# Patient Record
Sex: Female | Born: 1985 | Race: White | Hispanic: No | Marital: Single | Smoking: Former smoker
Health system: Southern US, Community
[De-identification: ages and names within clinical notes are randomized; demographics above are authoritative.]

## PROBLEM LIST (undated history)

## (undated) DIAGNOSIS — D219 Benign neoplasm of connective and other soft tissue, unspecified: Secondary | ICD-10-CM

## (undated) DIAGNOSIS — F329 Major depressive disorder, single episode, unspecified: Secondary | ICD-10-CM

## (undated) DIAGNOSIS — F32A Depression, unspecified: Secondary | ICD-10-CM

## (undated) DIAGNOSIS — F4310 Post-traumatic stress disorder, unspecified: Secondary | ICD-10-CM

## (undated) HISTORY — DX: Major depressive disorder, single episode, unspecified: F32.9

## (undated) HISTORY — DX: Post-traumatic stress disorder, unspecified: F43.10

## (undated) HISTORY — DX: Depression, unspecified: F32.A

---

## 2004-01-12 ENCOUNTER — Inpatient Hospital Stay (HOSPITAL_COMMUNITY): Admission: AD | Admit: 2004-01-12 | Discharge: 2004-01-12 | Payer: Self-pay

## 2004-01-23 ENCOUNTER — Inpatient Hospital Stay (HOSPITAL_COMMUNITY): Admission: RE | Admit: 2004-01-23 | Discharge: 2004-01-25 | Payer: Self-pay | Admitting: Gynecology

## 2004-10-26 ENCOUNTER — Emergency Department: Payer: Self-pay | Admitting: Emergency Medicine

## 2008-02-08 ENCOUNTER — Emergency Department: Payer: Self-pay | Admitting: Emergency Medicine

## 2011-01-10 ENCOUNTER — Emergency Department: Payer: Self-pay | Admitting: Emergency Medicine

## 2012-06-14 ENCOUNTER — Emergency Department: Payer: Self-pay | Admitting: Emergency Medicine

## 2013-07-31 ENCOUNTER — Emergency Department: Payer: Self-pay | Admitting: Emergency Medicine

## 2013-08-02 LAB — BETA STREP CULTURE(ARMC)

## 2014-03-01 ENCOUNTER — Emergency Department: Payer: Self-pay | Admitting: Student

## 2014-03-01 LAB — BASIC METABOLIC PANEL
ANION GAP: 6 — AB (ref 7–16)
BUN: 10 mg/dL (ref 7–18)
CALCIUM: 8.3 mg/dL — AB (ref 8.5–10.1)
CHLORIDE: 108 mmol/L — AB (ref 98–107)
CREATININE: 0.85 mg/dL (ref 0.60–1.30)
Co2: 26 mmol/L (ref 21–32)
EGFR (Non-African Amer.): >60
Glucose: 77 mg/dL (ref 65–99)
Osmolality: 277 (ref 275–301)
Potassium: 3.7 mmol/L (ref 3.5–5.1)
Sodium: 140 mmol/L (ref 136–145)

## 2014-03-01 LAB — URINALYSIS, COMPLETE
BACTERIA: NONE SEEN
BILIRUBIN, UR: NEGATIVE
BLOOD: NEGATIVE
Glucose,UR: NEGATIVE mg/dL (ref 0–75)
KETONE: NEGATIVE
Leukocyte Esterase: NEGATIVE
Nitrite: NEGATIVE
Ph: 7 (ref 4.5–8.0)
Protein: NEGATIVE
RBC,UR: 3 /HPF (ref 0–5)
SPECIFIC GRAVITY: 1.008 (ref 1.003–1.030)

## 2014-03-01 LAB — CBC WITH DIFFERENTIAL/PLATELET
BASOS ABS: 0.1 10*3/uL (ref 0.0–0.1)
Basophil %: 0.6 %
Eosinophil #: 0.3 10*3/uL (ref 0.0–0.7)
Eosinophil %: 1.9 %
HCT: 44.9 % (ref 35.0–47.0)
HGB: 15.5 g/dL (ref 12.0–16.0)
Lymphocyte #: 4.3 10*3/uL — ABNORMAL HIGH (ref 1.0–3.6)
Lymphocyte %: 29.8 %
MCH: 31.6 pg (ref 26.0–34.0)
MCHC: 34.4 g/dL (ref 32.0–36.0)
MCV: 92 fL (ref 80–100)
Monocyte #: 1 x10 3/mm — ABNORMAL HIGH (ref 0.2–0.9)
Monocyte %: 6.7 %
NEUTROS ABS: 8.8 10*3/uL — AB (ref 1.4–6.5)
NEUTROS PCT: 61 %
Platelet: 288 10*3/uL (ref 150–440)
RBC: 4.89 10*6/uL (ref 3.80–5.20)
RDW: 12.4 % (ref 11.5–14.5)
WBC: 14.4 10*3/uL — ABNORMAL HIGH (ref 3.6–11.0)

## 2014-03-01 LAB — TROPONIN I

## 2014-03-30 ENCOUNTER — Inpatient Hospital Stay: Payer: Self-pay | Admitting: Psychiatry

## 2014-03-30 LAB — URINALYSIS, COMPLETE
BILIRUBIN, UR: NEGATIVE
Glucose,UR: NEGATIVE mg/dL (ref 0–75)
NITRITE: NEGATIVE
PH: 6 (ref 4.5–8.0)
RBC,UR: 22 /HPF (ref 0–5)
SPECIFIC GRAVITY: 1.029 (ref 1.003–1.030)
Squamous Epithelial: 11

## 2014-03-30 LAB — COMPREHENSIVE METABOLIC PANEL
ALT: 17 U/L
ANION GAP: 6 — AB (ref 7–16)
Albumin: 4 g/dL (ref 3.4–5.0)
Alkaline Phosphatase: 68 U/L
BUN: 7 mg/dL (ref 7–18)
Bilirubin,Total: 0.8 mg/dL (ref 0.2–1.0)
CHLORIDE: 107 mmol/L (ref 98–107)
CO2: 26 mmol/L (ref 21–32)
Calcium, Total: 8.8 mg/dL (ref 8.5–10.1)
Creatinine: 0.9 mg/dL (ref 0.60–1.30)
EGFR (African American): >60
Glucose: 132 mg/dL — ABNORMAL HIGH (ref 65–99)
Osmolality: 277 (ref 275–301)
Potassium: 3.4 mmol/L — ABNORMAL LOW (ref 3.5–5.1)
SGOT(AST): 13 U/L — ABNORMAL LOW (ref 15–37)
SODIUM: 139 mmol/L (ref 136–145)
Total Protein: 7.2 g/dL (ref 6.4–8.2)

## 2014-03-30 LAB — DRUG SCREEN, URINE

## 2014-03-30 LAB — CBC
HCT: 47.4 % — ABNORMAL HIGH (ref 35.0–47.0)
HGB: 15.9 g/dL (ref 12.0–16.0)
MCH: 31.4 pg (ref 26.0–34.0)
MCHC: 33.5 g/dL (ref 32.0–36.0)
MCV: 94 fL (ref 80–100)
PLATELETS: 307 10*3/uL (ref 150–440)
RBC: 5.06 10*6/uL (ref 3.80–5.20)
RDW: 12.6 % (ref 11.5–14.5)
WBC: 13.3 10*3/uL — ABNORMAL HIGH (ref 3.6–11.0)

## 2014-03-30 LAB — SALICYLATE LEVEL: Salicylates, Serum: 4.4 mg/dL — ABNORMAL HIGH

## 2014-03-30 LAB — ETHANOL: Ethanol: <3 mg/dL

## 2014-03-30 LAB — ACETAMINOPHEN LEVEL: Acetaminophen: <2 ug/mL

## 2014-04-01 LAB — TSH: Thyroid Stimulating Horm: 1.33 u[IU]/mL

## 2014-05-07 ENCOUNTER — Emergency Department: Payer: Self-pay | Admitting: Emergency Medicine

## 2014-05-07 LAB — CBC WITH DIFFERENTIAL/PLATELET
BASOS PCT: 0.5 %
Basophil #: 0.1 10*3/uL (ref 0.0–0.1)
EOS PCT: 2.1 %
Eosinophil #: 0.3 10*3/uL (ref 0.0–0.7)
HCT: 44.1 % (ref 35.0–47.0)
HGB: 14.7 g/dL (ref 12.0–16.0)
LYMPHS ABS: 3.2 10*3/uL (ref 1.0–3.6)
Lymphocyte %: 26.6 %
MCH: 31.9 pg (ref 26.0–34.0)
MCHC: 33.3 g/dL (ref 32.0–36.0)
MCV: 96 fL (ref 80–100)
MONO ABS: 0.8 x10 3/mm (ref 0.2–0.9)
Monocyte %: 7 %
Neutrophil #: 7.7 10*3/uL — ABNORMAL HIGH (ref 1.4–6.5)
Neutrophil %: 63.8 %
Platelet: 232 10*3/uL (ref 150–440)
RBC: 4.61 10*6/uL (ref 3.80–5.20)
RDW: 13.1 % (ref 11.5–14.5)
WBC: 12.1 10*3/uL — ABNORMAL HIGH (ref 3.6–11.0)

## 2014-05-07 LAB — BASIC METABOLIC PANEL
Anion Gap: 7 (ref 7–16)
BUN: 6 mg/dL — ABNORMAL LOW (ref 7–18)
CALCIUM: 8.1 mg/dL — AB (ref 8.5–10.1)
CO2: 23 mmol/L (ref 21–32)
Chloride: 111 mmol/L — ABNORMAL HIGH (ref 98–107)
Creatinine: 0.82 mg/dL (ref 0.60–1.30)
EGFR (African American): >60
EGFR (Non-African Amer.): >60
Glucose: 82 mg/dL (ref 65–99)
Osmolality: 278 (ref 275–301)
Potassium: 3.9 mmol/L (ref 3.5–5.1)
Sodium: 141 mmol/L (ref 136–145)

## 2014-05-07 LAB — TROPONIN I: Troponin-I: <0.02 ng/mL

## 2014-06-05 ENCOUNTER — Emergency Department: Payer: Self-pay | Admitting: Internal Medicine

## 2014-06-30 ENCOUNTER — Emergency Department: Payer: Self-pay | Admitting: Emergency Medicine

## 2014-08-07 ENCOUNTER — Emergency Department
Admit: 2014-08-07 | Disposition: A | Discharge status: Home / Self Care | Payer: Self-pay | Admitting: Emergency Medicine

## 2014-08-09 NOTE — Consult Note (Signed)
PATIENT NAME:  Zoe Savage, GESELL MR#:  [id number] DATE OF BIRTH:  1985-10-12  DATE OF CONSULTATION:  03/30/2014  REFERRING PHYSICIAN:   CONSULTING PHYSICIAN:  Reisa Coppola K. Franchot Mimes, MD  PLACE OF DICTATION:  ER at [facility], [place], [place], [place].  RACE:  White.  SUBJECTIVE: The patient was seen in consultation in ER at [facility], [place].  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 The patient is married for 12 years and has 2 children that are 45 year old and 93 year old. Currently separated for 2 weeks per his choice.  The patient quite depressed about her current living situation and so she came here for help.    CHIEF COMPLAINT: "I need help. I'm feeling depressed."  HISTORY OF PRESENT ILLNESS:  The patient started getting depressed when separation after husband of 12 years left her with 2 kids.  The patient started having crying spells while talking about this, in fact, she could not stop crying when talking about her children and husband leaving her.    PAST PSYCHIATRIC HISTORY:  No previous history of Inpt to psychiatry.  No history of suicide attempts and not being followed by any psychiatrist.  ALCOHOL AND DRUGS:  Denies drinking alcohol. Does admit smoking THC occasionally.  Does admit smoking nicotine cigarettes 1/3 pack a day for many years.  MENTAL STATUS:  The patient is disheveled in appearance, alert and oriented, calm and quiet at present.  Affect is flat.  Mood restricted.  Very depressed. Crying spells which she could not stop.  She was crying throughout the interview. Does not appear to be responding to internal stimuli.  Cognition intact.  General knowledge and information fair.  Does have suicidal wishes and thoughts, but currently contracted for safety and wants to get better.  Insight and judgment guarded.  Impulse control poor.  IMPRESSION:  Major depressive  disorder, single episode.  RECOMMENDATIONS:  Inpatient hospital in psychiatry for close observation and evaluation.    ____________________________ Wallace Cullens. Franchot Mimes, MD skc:LT D: 03/30/2014 18:03:59 ET T: 03/30/2014 19:17:27 ET JOB#: [id number]  cc: Arlyn Leak K. Franchot Mimes, MD, <Dictator> Dewain Penning MD ELECTRONICALLY SIGNED 04/05/2014 15:13
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
## 2015-03-02 ENCOUNTER — Emergency Department: Payer: Self-pay

## 2015-03-02 ENCOUNTER — Emergency Department
Admission: EM | Admit: 2015-03-02 | Discharge: 2015-03-02 | Disposition: A | Discharge status: Home / Self Care | Payer: Self-pay | Attending: Emergency Medicine | Admitting: Emergency Medicine

## 2015-03-02 ENCOUNTER — Encounter: Payer: Self-pay | Admitting: Emergency Medicine

## 2015-03-02 DIAGNOSIS — N939 Abnormal uterine and vaginal bleeding, unspecified: Secondary | ICD-10-CM | POA: Insufficient documentation

## 2015-03-02 DIAGNOSIS — F17210 Nicotine dependence, cigarettes, uncomplicated: Secondary | ICD-10-CM | POA: Insufficient documentation

## 2015-03-02 DIAGNOSIS — Z3202 Encounter for pregnancy test, result negative: Secondary | ICD-10-CM | POA: Insufficient documentation

## 2015-03-02 HISTORY — DX: Benign neoplasm of connective and other soft tissue, unspecified: D21.9

## 2015-03-02 LAB — URINALYSIS COMPLETE WITH MICROSCOPIC (ARMC ONLY)
BACTERIA UA: NONE SEEN
Bilirubin Urine: NEGATIVE
Glucose, UA: NEGATIVE mg/dL
Ketones, ur: NEGATIVE mg/dL
Leukocytes, UA: NEGATIVE
Nitrite: NEGATIVE
PROTEIN: NEGATIVE mg/dL
Specific Gravity, Urine: 1.017 (ref 1.005–1.030)
pH: 6 (ref 5.0–8.0)

## 2015-03-02 LAB — BASIC METABOLIC PANEL
ANION GAP: 6 (ref 5–15)
BUN: 10 mg/dL (ref 6–20)
CO2: 27 mmol/L (ref 22–32)
Calcium: 9.1 mg/dL (ref 8.9–10.3)
Chloride: 103 mmol/L (ref 101–111)
Creatinine, Ser: 0.72 mg/dL (ref 0.44–1.00)
Glucose, Bld: 105 mg/dL — ABNORMAL HIGH (ref 65–99)
POTASSIUM: 3.7 mmol/L (ref 3.5–5.1)
SODIUM: 136 mmol/L (ref 135–145)

## 2015-03-02 LAB — CBC
HEMATOCRIT: 45.1 % (ref 35.0–47.0)
HEMOGLOBIN: 15.5 g/dL (ref 12.0–16.0)
MCH: 32 pg (ref 26.0–34.0)
MCHC: 34.4 g/dL (ref 32.0–36.0)
MCV: 93.2 fL (ref 80.0–100.0)
Platelets: 338 10*3/uL (ref 150–440)
RBC: 4.84 MIL/uL (ref 3.80–5.20)
RDW: 12.5 % (ref 11.5–14.5)
WBC: 14.2 10*3/uL — ABNORMAL HIGH (ref 3.6–11.0)

## 2015-03-02 LAB — POCT PREGNANCY, URINE: PREG TEST UR: NEGATIVE

## 2015-03-02 MED ORDER — PROCHLORPERAZINE MALEATE 10 MG PO TABS
10.0000 mg | ORAL_TABLET | Freq: Once | ORAL | Status: AC
  Administered 2015-03-02: 10 mg via ORAL
  Filled 2015-03-02: qty 1

## 2015-03-02 NOTE — ED Notes (Signed)
AAOx3.  Skin warm and dry.,  NAD.,

## 2015-03-02 NOTE — ED Notes (Signed)
Denies dysuria.  C/O vaginal bleeding x 3 weeks.  Blood flow intermittently heavy. States blood flow is minimal today.

## 2015-03-02 NOTE — Discharge Instructions (Signed)
Please seek medical attention for any high fevers, chest pain, shortness of breath, change in behavior, persistent vomiting, bloody stool or any other new or concerning symptoms.   Abnormal Uterine Bleeding Abnormal uterine bleeding can affect women at various stages in life, including teenagers, women in their reproductive years, pregnant women, and women who have reached menopause. Several kinds of uterine bleeding are considered abnormal, including:  Bleeding or spotting between periods.   Bleeding after sexual intercourse.   Bleeding that is heavier or more than normal.   Periods that last longer than usual.  Bleeding after menopause.  Many cases of abnormal uterine bleeding are minor and simple to treat, while others are more serious. Any type of abnormal bleeding should be evaluated by your health care provider. Treatment will depend on the cause of the bleeding. HOME CARE INSTRUCTIONS Monitor your condition for any changes. The following actions may help to alleviate any discomfort you are experiencing:  Avoid the use of tampons and douches as directed by your health care provider.  Change your pads frequently. You should get regular pelvic exams and Pap tests. Keep all follow-up appointments for diagnostic tests as directed by your health care provider.  SEEK MEDICAL CARE IF:   Your bleeding lasts more than 1 week.   You feel dizzy at times.  SEEK IMMEDIATE MEDICAL CARE IF:   You pass out.   You are changing pads every 15 to 30 minutes.   You have abdominal pain.  You have a fever.   You become sweaty or weak.   You are passing large blood clots from the vagina.   You start to feel nauseous and vomit. MAKE SURE YOU:   Understand these instructions.  Will watch your condition.  Will get help right away if you are not doing well or get worse.   This information is not intended to replace advice given to you by your health care provider. Make sure  you discuss any questions you have with your health care provider.   Document Released: 04/04/2005 Document Revised: 04/09/2013 Document Reviewed: 11/01/2012 Elsevier Interactive Patient Education [employer].

## 2015-03-02 NOTE — ED Provider Notes (Signed)
[facility] Emergency Department Provider Note   ____________________________________________  Time seen: 2030  I have reviewed the triage vital signs and the nursing notes.   HISTORY  Chief Complaint Vaginal Bleeding; Headache; and Back Pain   History limited by: Not Limited   HPI Zoe Savage is a 29 y.o. female who presents to the emergency department today with primary complaint of vaginal bleeding. The patient states that she has been having vaginal bleeding for the past 3 weeks. It is intermittently heavier. She has had some mild, cramping. She states she usually is fairly regular. The past couple of days she developed a frontal headache as well as low back pain. She has a history of migraines but states the headache is slightly different. She denies the same constellation of symptoms in the past.   Past Medical History  Diagnosis Date  . Fibroids     There are no active problems to display for this patient.   Past Surgical History  Procedure Laterality Date  . Cesarean section      No current outpatient prescriptions on file.  Allergies Morphine and related  No family history on file.  Social History Social History  Substance Use Topics  . Smoking status: Current Every Day Smoker -- 1.00 packs/day    Types: Cigarettes  . Smokeless tobacco: None  . Alcohol Use: No    Review of Systems  Constitutional: Negative for fever. Cardiovascular: Negative for chest pain. Respiratory: Negative for shortness of breath. Gastrointestinal: Negative for abdominal pain, vomiting and diarrhea. Genitourinary: Negative for dysuria. Musculoskeletal: Negative for back pain. Skin: Negative for rash. Neurological: Negative for headaches, focal weakness or numbness.  10-point ROS otherwise negative.  ____________________________________________   PHYSICAL EXAM:  VITAL SIGNS: ED Triage Vitals  Enc Vitals Group     BP 03/02/15 1923  138/82 mmHg     Pulse Rate 03/02/15 1923 77     Resp --      Temp 03/02/15 1923 98.3 F (36.8 C)     Temp Source 03/02/15 1923 Oral     SpO2 03/02/15 1923 98 %     Weight 03/02/15 1923 175 lb (79.379 kg)     Height 03/02/15 1923 5\' 2"  (1.575 m)     Head Cir --      Peak Flow --      Pain Score 03/02/15 1926 8   Constitutional: Alert and oriented. Well appearing and in no distress. Eyes: Conjunctivae are normal. PERRL. Normal extraocular movements. ENT   Head: Normocephalic and atraumatic.   Nose: No congestion/rhinnorhea.   Mouth/Throat: Mucous membranes are moist.   Neck: No stridor. Hematological/Lymphatic/Immunilogical: No cervical lymphadenopathy. Cardiovascular: Normal rate, regular rhythm.  No murmurs, rubs, or gallops. Respiratory: Normal respiratory effort without tachypnea nor retractions. Breath sounds are clear and equal bilaterally. No wheezes/rales/rhonchi. Gastrointestinal: Soft and nontender. No distention.  Genitourinary: Deferred Musculoskeletal: Normal range of motion in all extremities. No joint effusions.  No lower extremity tenderness nor edema. Neurologic:  Normal speech and language. No gross focal neurologic deficits are appreciated.  Skin:  Skin is warm, dry and intact. No rash noted. Psychiatric: Mood and affect are normal. Speech and behavior are normal. Patient exhibits appropriate insight and judgment.  ____________________________________________    LABS (pertinent positives/negatives)  Labs Reviewed  CBC - Abnormal; Notable for the following:    WBC 14.2 (*)    All other components within normal limits  URINALYSIS COMPLETEWITH MICROSCOPIC ([facility] ONLY) - Abnormal; Notable for the  following:    Color, Urine YELLOW (*)    APPearance CLEAR (*)    Hgb urine dipstick 2+ (*)    Squamous Epithelial / LPF 0-5 (*)    All other components within normal limits  BASIC METABOLIC PANEL - Abnormal; Notable for the following:    Glucose, Bld 105  (*)    All other components within normal limits  POC URINE PREG, ED  POCT PREGNANCY, URINE     ____________________________________________   EKG  None  ____________________________________________    RADIOLOGY  Transvaginal US  IMPRESSION: 1. No evidence of mass, torsion, or other acute abnormality.    ____________________________________________   PROCEDURES  Procedure(s) performed: None  Critical Care performed: No  ____________________________________________   INITIAL IMPRESSION / ASSESSMENT AND PLAN / ED COURSE  Pertinent labs & imaging results that were available during my care of the patient were reviewed by me and considered in my medical decision making (see chart for details).  Patient presented to the emergency department today with concerns for abnormal vaginal bleeding. It's been going on for roughly 2-3 weeks. Blood work without any anemia. Vital signs stable. Ultrasound was performed which did not show any obvious cause of the bleeding. Discussed with patient importance of following up with OB/GYN. Discussed with patient acute blood loss return precautions.  ____________________________________________   FINAL CLINICAL IMPRESSION(S) / ED DIAGNOSES  Final diagnoses:  Abnormal vaginal bleeding  Abnormal vaginal bleeding     Nance Pear, MD 03/02/15 2228

## 2015-03-02 NOTE — ED Notes (Signed)
Patient ambulatory to triage with steady gait, without difficulty or distress noted; pt reports vag bleeding x 2-3wks accomp by frontal HA and lower back pain; denies hx of same

## 2015-05-22 ENCOUNTER — Encounter: Payer: Self-pay | Admitting: Emergency Medicine

## 2015-05-22 ENCOUNTER — Emergency Department
Admission: EM | Admit: 2015-05-22 | Discharge: 2015-05-22 | Disposition: A | Discharge status: Home / Self Care | Payer: No Typology Code available for payment source | Attending: Emergency Medicine | Admitting: Emergency Medicine

## 2015-05-22 DIAGNOSIS — S0990XA Unspecified injury of head, initial encounter: Secondary | ICD-10-CM | POA: Insufficient documentation

## 2015-05-22 DIAGNOSIS — O99331 Smoking (tobacco) complicating pregnancy, first trimester: Secondary | ICD-10-CM | POA: Diagnosis not present

## 2015-05-22 DIAGNOSIS — F17210 Nicotine dependence, cigarettes, uncomplicated: Secondary | ICD-10-CM | POA: Insufficient documentation

## 2015-05-22 DIAGNOSIS — Y92410 Unspecified street and highway as the place of occurrence of the external cause: Secondary | ICD-10-CM | POA: Insufficient documentation

## 2015-05-22 DIAGNOSIS — Y998 Other external cause status: Secondary | ICD-10-CM | POA: Diagnosis not present

## 2015-05-22 DIAGNOSIS — Z3A00 Weeks of gestation of pregnancy not specified: Secondary | ICD-10-CM | POA: Insufficient documentation

## 2015-05-22 DIAGNOSIS — O9A211 Injury, poisoning and certain other consequences of external causes complicating pregnancy, first trimester: Secondary | ICD-10-CM | POA: Diagnosis present

## 2015-05-22 DIAGNOSIS — Z3491 Encounter for supervision of normal pregnancy, unspecified, first trimester: Secondary | ICD-10-CM

## 2015-05-22 DIAGNOSIS — S199XXA Unspecified injury of neck, initial encounter: Secondary | ICD-10-CM | POA: Diagnosis not present

## 2015-05-22 DIAGNOSIS — Y9389 Activity, other specified: Secondary | ICD-10-CM | POA: Insufficient documentation

## 2015-05-22 DIAGNOSIS — S3991XA Unspecified injury of abdomen, initial encounter: Secondary | ICD-10-CM | POA: Diagnosis not present

## 2015-05-22 DIAGNOSIS — S3992XA Unspecified injury of lower back, initial encounter: Secondary | ICD-10-CM | POA: Diagnosis not present

## 2015-05-22 LAB — URINALYSIS COMPLETE WITH MICROSCOPIC (ARMC ONLY)
BILIRUBIN URINE: NEGATIVE
Bacteria, UA: NONE SEEN
Glucose, UA: NEGATIVE mg/dL
Hgb urine dipstick: NEGATIVE
Ketones, ur: NEGATIVE mg/dL
Nitrite: NEGATIVE
PH: 6 (ref 5.0–8.0)
Protein, ur: NEGATIVE mg/dL
Specific Gravity, Urine: 1.02 (ref 1.005–1.030)

## 2015-05-22 LAB — POCT PREGNANCY, URINE: Preg Test, Ur: POSITIVE — AB

## 2015-05-22 MED ORDER — CYCLOBENZAPRINE HCL 10 MG PO TABS: 10.0000 mg | ORAL_TABLET | Freq: Three times a day (TID) | ORAL | Status: DC | PRN

## 2015-05-22 MED ORDER — CYCLOBENZAPRINE HCL 10 MG PO TABS
10.0000 mg | ORAL_TABLET | Freq: Once | ORAL | Status: AC
  Administered 2015-05-22: 10 mg via ORAL
  Filled 2015-05-22: qty 1

## 2015-05-22 MED ORDER — ACETAMINOPHEN 500 MG PO TABS
1000.0000 mg | ORAL_TABLET | Freq: Once | ORAL | Status: AC
  Administered 2015-05-22: 1000 mg via ORAL
  Filled 2015-05-22: qty 2

## 2015-05-22 MED ORDER — ONDANSETRON HCL 4 MG PO TABS: 4.0000 mg | ORAL_TABLET | Freq: Every day | ORAL | Status: DC | PRN
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
## 2015-06-03 ENCOUNTER — Emergency Department: Payer: Self-pay

## 2015-06-03 ENCOUNTER — Emergency Department
Admission: EM | Admit: 2015-06-03 | Discharge: 2015-06-04 | Disposition: A | Discharge status: Home / Self Care | Payer: Self-pay | Attending: Emergency Medicine | Admitting: Emergency Medicine

## 2015-06-03 ENCOUNTER — Encounter: Payer: Self-pay | Admitting: Emergency Medicine

## 2015-06-03 DIAGNOSIS — R1030 Lower abdominal pain, unspecified: Secondary | ICD-10-CM | POA: Insufficient documentation

## 2015-06-03 DIAGNOSIS — N898 Other specified noninflammatory disorders of vagina: Secondary | ICD-10-CM | POA: Insufficient documentation

## 2015-06-03 DIAGNOSIS — O9989 Other specified diseases and conditions complicating pregnancy, childbirth and the puerperium: Secondary | ICD-10-CM | POA: Insufficient documentation

## 2015-06-03 DIAGNOSIS — O99331 Smoking (tobacco) complicating pregnancy, first trimester: Secondary | ICD-10-CM | POA: Insufficient documentation

## 2015-06-03 DIAGNOSIS — F17210 Nicotine dependence, cigarettes, uncomplicated: Secondary | ICD-10-CM | POA: Insufficient documentation

## 2015-06-03 DIAGNOSIS — Z3A01 Less than 8 weeks gestation of pregnancy: Secondary | ICD-10-CM | POA: Insufficient documentation

## 2015-06-03 DIAGNOSIS — R102 Pelvic and perineal pain: Secondary | ICD-10-CM | POA: Insufficient documentation

## 2015-06-03 DIAGNOSIS — R05 Cough: Secondary | ICD-10-CM | POA: Insufficient documentation

## 2015-06-03 DIAGNOSIS — O26891 Other specified pregnancy related conditions, first trimester: Secondary | ICD-10-CM

## 2015-06-03 LAB — URINALYSIS COMPLETE WITH MICROSCOPIC (ARMC ONLY)
BILIRUBIN URINE: NEGATIVE
GLUCOSE, UA: NEGATIVE mg/dL
HGB URINE DIPSTICK: NEGATIVE
Nitrite: NEGATIVE
PH: 5 (ref 5.0–8.0)
Protein, ur: 30 mg/dL — AB
SPECIFIC GRAVITY, URINE: 1.031 — AB (ref 1.005–1.030)

## 2015-06-03 LAB — POCT PREGNANCY, URINE: Preg Test, Ur: POSITIVE — AB

## 2015-06-03 LAB — HCG, QUANTITATIVE, PREGNANCY: HCG, BETA CHAIN, QUANT, S: 13845 m[IU]/mL — AB (ref <5)

## 2015-06-03 NOTE — Discharge Instructions (Signed)
Return to the ER for new or worsening pain, vaginal bleeding, passing out, or for any other concerns.  You need your blood pregnancy level rechecked in 48 hours.  Please come to the medical mall with the form you were given Friday night.  Let your doctor at [facility] know you need a repeat ultrasound in 2 wks.  Today, your blood pregnancy was   Ref. Range 06/03/2015 19:27  HCG, Beta Chain, Quant, S Latest Ref Range: <5 mIU/mL 13845 (H)  Your doctor can call to find out Friday's result.  You may show them these ultrasound results: IMPRESSION: Probable early intrauterine gestational sac, but no yolk sac, fetal pole, or cardiac activity yet visualized. Recommend follow-up quantitative B-HCG levels and follow-up US in 14 days to confirm and assess viability.  Abdominal Pain During Pregnancy Abdominal pain is common in pregnancy. Most of the time, it does not cause harm. There are many causes of abdominal pain. Some causes are more serious than others. Some of the causes of abdominal pain in pregnancy are easily diagnosed. Occasionally, the diagnosis takes time to understand. Other times, the cause is not determined. Abdominal pain can be a sign that something is very wrong with the pregnancy, or the pain may have nothing to do with the pregnancy at all. For this reason, always tell your health care provider if you have any abdominal discomfort. HOME CARE INSTRUCTIONS  Monitor your abdominal pain for any changes. The following actions may help to alleviate any discomfort you are experiencing:  Do not have sexual intercourse or put anything in your vagina until your symptoms go away completely.  Get plenty of rest until your pain improves.  Drink clear fluids if you feel nauseous. Avoid solid food as long as you are uncomfortable or nauseous.  Only take over-the-counter or prescription medicine as directed by your health care provider.  Keep all follow-up appointments with your health care  provider. SEEK IMMEDIATE MEDICAL CARE IF:  You are bleeding, leaking fluid, or passing tissue from the vagina.  You have increasing pain or cramping.  You have persistent vomiting.  You have painful or bloody urination.  You have a fever.  You notice a decrease in your baby's movements.  You have extreme weakness or feel faint.  You have shortness of breath, with or without abdominal pain.  You develop a severe headache with abdominal pain.  You have abnormal vaginal discharge with abdominal pain.  You have persistent diarrhea.  You have abdominal pain that continues even after rest, or gets worse. MAKE SURE YOU:   Understand these instructions.  Will watch your condition.  Will get help right away if you are not doing well or get worse.   This information is not intended to replace advice given to you by your health care provider. Make sure you discuss any questions you have with your health care provider.   Document Released: 04/04/2005 Document Revised: 01/23/2013 Document Reviewed: 11/01/2012 Elsevier Interactive Patient Education [employer].

## 2015-06-03 NOTE — ED Provider Notes (Signed)
[facility] Emergency Department Provider Note ____________________________________________  Time seen: Approximately 9:12 PM  I have reviewed the triage vital signs and the nursing notes.   HISTORY  Chief Complaint Pelvic Pain   HPI Zoe Savage is a 30 y.o. female G3P2 with LMP 12/10, but has very irregular cycles, who presents with lower abdominal cramps for several weeks that have gotten worse tonight. She has not had any vaginal bleeding. She did have cramps with her prior pregnancies. Vaginal discharge is consistent with prior pregnancies. She lives in [place] and first [place] [facility]  She states she walks 20 miles at work every night.  She also reports having a cough and is wondering if she could've pulled a muscle.   Past Medical History  Diagnosis Date  . Fibroids     There are no active problems to display for this patient.   Past Surgical History  Procedure Laterality Date  . Cesarean section      Current Outpatient Rx  Name  Route  Sig  Dispense  Refill  . cyclobenzaprine (FLEXERIL) 10 MG tablet   Oral   Take 1 tablet (10 mg total) by mouth every 8 (eight) hours as needed for muscle spasms.   30 tablet   1   . ondansetron (ZOFRAN) 4 MG tablet   Oral   Take 1 tablet (4 mg total) by mouth daily as needed for nausea or vomiting.   20 tablet   1     Allergies Morphine and related  No family history on file.  Social History Social History  Substance Use Topics  . Smoking status: Current Every Day Smoker -- 1.00 packs/day    Types: Cigarettes  . Smokeless tobacco: None  . Alcohol Use: No    Review of Systems Constitutional: No fever/chills Cardiovascular: Denies chest pain. Respiratory: Denies shortness of breath. Gastrointestinal:see hpi Genitourinary: see hpi.  10-point ROS otherwise negative.  ____________________________________________   PHYSICAL EXAM:  VITAL SIGNS: ED  Triage Vitals  Enc Vitals Group     BP 06/03/15 1921 114/65 mmHg     Pulse Rate 06/03/15 1921 91     Resp 06/03/15 1921 18     Temp 06/03/15 1921 98.2 F (36.8 C)     Temp Source 06/03/15 1921 Oral     SpO2 06/03/15 1921 97 %     Weight 06/03/15 1921 190 lb (86.183 kg)     Height 06/03/15 1921 5\' 3"  (1.6 m)     Head Cir --      Peak Flow --      Pain Score 06/03/15 1922 4     Pain Loc --      Pain Edu? --      Excl. in [place]? --    Constitutional: Alert and oriented. Well appearing and in no acute distress. Eyes: Conjunctivae are normal. PERRL. EOMI. Head: Atraumatic. Nose: No congestion/rhinnorhea. Mouth/Throat: Mucous membranes are moist.  Oropharynx non-erythematous. Neck: No stridor.  Cardiovascular: Normal rate, regular rhythm. Grossly normal heart sounds.  Good peripheral circulation. Respiratory: Normal respiratory effort.  No retractions. Lungs CTAB. Gastrointestinal: Soft and nontender. No distention.  GU: Normal external genitalia, white vaginal discharge, no bleeding, no cervical motion tenderness, mild bilateral adnexal tenderness Musculoskeletal: No lower extremity tenderness nor edema.   Neurologic:  Normal speech and language. No gross focal neurologic deficits are appreciated. No gait instability. Skin:  Skin is warm, dry and intact. No rash noted. Psychiatric: Mood  and affect are normal. Speech and behavior are normal.  ____________________________________________   LABS (all labs ordered are listed, but only abnormal results are displayed)  Labs Reviewed  HCG, QUANTITATIVE, PREGNANCY - Abnormal; Notable for the following:    hCG, Beta Chain, Quant, S 13845 (*)    All other components within normal limits  URINALYSIS COMPLETEWITH MICROSCOPIC ([facility] ONLY) - Abnormal; Notable for the following:    Color, Urine AMBER (*)    APPearance HAZY (*)    Ketones, ur TRACE (*)    Specific Gravity, Urine 1.031 (*)    Protein, ur 30 (*)    Leukocytes, UA TRACE (*)     Bacteria, UA FEW (*)    Squamous Epithelial / LPF 6-30 (*)    All other components within normal limits  POCT PREGNANCY, URINE - Abnormal; Notable for the following:    Preg Test, Ur POSITIVE (*)    All other components within normal limits  ABO/RH   ________________________________________________________________________________  RADIOLOGY  1st trimester u/s: IMPRESSION: Probable early intrauterine gestational sac, but no yolk sac, fetal pole, or cardiac activity yet visualized. Recommend follow-up quantitative B-HCG levels and follow-up US in 14 days to confirm and assess viability. This recommendation follows SRU consensus guidelines: Diagnostic Criteria for Nonviable Pregnancy Early in the First Trimester. [place] Med [id number].  By mean sac diameter, the estimated gestational age is 5 weeks 6 days. ____________________________________________   INITIAL IMPRESSION / ASSESSMENT AND PLAN / ED COURSE  Pertinent labs & imaging results that were available during my care of the patient were reviewed by me and considered in my medical decision making (see chart for details).  Patient with bilateral lower pelvic pain in first trimester pregnancy. She has a follow-up in 6 days with her OB/GYN & will get her repeat B-HCG quant at the outpatient lab slip that I ordered for here in 48 hours. She understands she needs to have a follow-up ultrasound in 2 weeks. I will cancel the group and Rh as she has no bleeding. ____________________________________________   FINAL CLINICAL IMPRESSION(S) / ED DIAGNOSES  Pelvic pain in 1st trimester pregnancy   Ponciano Ort, MD 06/03/15 2306

## 2015-06-03 NOTE — ED Notes (Signed)
Pt in w/ complaints of intermittent lower abdominal cramping today, reporting severe cramping around 1700.  Pt denies pain at this time.  Pt denies any vaginal bleeding.  Pt reports she is [redacted] weeks pregnant but has not had first OB appointment yet.

## 2015-06-03 NOTE — ED Notes (Signed)
Patient transported to Ultrasound 

## 2015-06-03 NOTE — ED Notes (Signed)
Lower abdominal cramps.  Onset of symptoms this morning, worsening throughout the day becoming "really bad" at 1700.  Denies vaginal bleeding but has noticed large amount of clear vaginal drainage today.  Denies dysuria.  Patient is [redacted] weeks pregnant.  LMP:  03/28/2015.  Has first appointment with OB next Tuesday 2/21.

## 2015-06-04 LAB — CHLAMYDIA/NGC RT PCR (ARMC ONLY)
CHLAMYDIA TR: NOT DETECTED
N gonorrhoeae: NOT DETECTED

## 2015-06-04 LAB — WET PREP, GENITAL
CLUE CELLS WET PREP: NONE SEEN
Sperm: NONE SEEN
Trich, Wet Prep: NONE SEEN
WBC WET PREP: NONE SEEN
Yeast Wet Prep HPF POC: NONE SEEN

## 2015-06-08 ENCOUNTER — Other Ambulatory Visit: Payer: Self-pay | Admitting: Obstetrics and Gynecology

## 2015-06-08 DIAGNOSIS — O3680X0 Pregnancy with inconclusive fetal viability, not applicable or unspecified: Secondary | ICD-10-CM

## 2015-06-09 ENCOUNTER — Ambulatory Visit (INDEPENDENT_AMBULATORY_CARE_PROVIDER_SITE_OTHER): Payer: Managed Care, Other (non HMO)

## 2015-06-09 DIAGNOSIS — Z3A01 Less than 8 weeks gestation of pregnancy: Secondary | ICD-10-CM

## 2015-06-09 DIAGNOSIS — O3680X0 Pregnancy with inconclusive fetal viability, not applicable or unspecified: Secondary | ICD-10-CM | POA: Diagnosis not present

## 2015-06-09 NOTE — Progress Notes (Signed)
[place] 6+2 wks single IUP w/ys,pos fht 122 bpm,normal ov's bilat,crl 6.23mm

## 2015-06-17 ENCOUNTER — Ambulatory Visit (INDEPENDENT_AMBULATORY_CARE_PROVIDER_SITE_OTHER): Payer: Managed Care, Other (non HMO) | Admitting: Advanced Practice Midwife

## 2015-06-17 ENCOUNTER — Encounter: Payer: Self-pay | Admitting: Advanced Practice Midwife

## 2015-06-17 ENCOUNTER — Other Ambulatory Visit (HOSPITAL_COMMUNITY)
Admission: RE | Admit: 2015-06-17 | Discharge: 2015-06-17 | Disposition: A | Discharge status: Home / Self Care | Payer: Managed Care, Other (non HMO) | Source: Ambulatory Visit | Attending: Advanced Practice Midwife | Admitting: Advanced Practice Midwife

## 2015-06-17 VITALS — BP 110/70 | HR 93 | Wt 208.0 lb

## 2015-06-17 DIAGNOSIS — Z3490 Encounter for supervision of normal pregnancy, unspecified, unspecified trimester: Secondary | ICD-10-CM | POA: Insufficient documentation

## 2015-06-17 DIAGNOSIS — Z3481 Encounter for supervision of other normal pregnancy, first trimester: Secondary | ICD-10-CM

## 2015-06-17 DIAGNOSIS — O34219 Maternal care for unspecified type scar from previous cesarean delivery: Secondary | ICD-10-CM | POA: Diagnosis not present

## 2015-06-17 DIAGNOSIS — Z01419 Encounter for gynecological examination (general) (routine) without abnormal findings: Secondary | ICD-10-CM | POA: Diagnosis present

## 2015-06-17 DIAGNOSIS — Z1151 Encounter for screening for human papillomavirus (HPV): Secondary | ICD-10-CM | POA: Insufficient documentation

## 2015-06-17 DIAGNOSIS — Z113 Encounter for screening for infections with a predominantly sexual mode of transmission: Secondary | ICD-10-CM | POA: Insufficient documentation

## 2015-06-17 DIAGNOSIS — F17200 Nicotine dependence, unspecified, uncomplicated: Secondary | ICD-10-CM

## 2015-06-17 DIAGNOSIS — Z3491 Encounter for supervision of normal pregnancy, unspecified, first trimester: Secondary | ICD-10-CM

## 2015-06-17 DIAGNOSIS — Z1389 Encounter for screening for other disorder: Secondary | ICD-10-CM | POA: Diagnosis not present

## 2015-06-17 DIAGNOSIS — O99331 Smoking (tobacco) complicating pregnancy, first trimester: Secondary | ICD-10-CM | POA: Diagnosis not present

## 2015-06-17 DIAGNOSIS — Z3682 Encounter for antenatal screening for nuchal translucency: Secondary | ICD-10-CM

## 2015-06-17 DIAGNOSIS — Z3A08 8 weeks gestation of pregnancy: Secondary | ICD-10-CM | POA: Diagnosis not present

## 2015-06-17 DIAGNOSIS — Z98891 History of uterine scar from previous surgery: Secondary | ICD-10-CM

## 2015-06-17 DIAGNOSIS — Z0283 Encounter for blood-alcohol and blood-drug test: Secondary | ICD-10-CM

## 2015-06-17 DIAGNOSIS — Z331 Pregnant state, incidental: Secondary | ICD-10-CM | POA: Diagnosis not present

## 2015-06-17 DIAGNOSIS — Z124 Encounter for screening for malignant neoplasm of cervix: Secondary | ICD-10-CM

## 2015-06-17 DIAGNOSIS — Z369 Encounter for antenatal screening, unspecified: Secondary | ICD-10-CM

## 2015-06-17 NOTE — Patient Instructions (Signed)
 First Trimester of Pregnancy The first trimester of pregnancy is from week 1 until the end of week 12 (months 1 through 3). A week after a sperm fertilizes an egg, the egg will implant on the wall of the uterus. This embryo will begin to develop into a baby. Genes from you and your partner are forming the baby. The female genes determine whether the baby is a boy or a girl. At 6-8 weeks, the eyes and face are formed, and the heartbeat can be seen on ultrasound. At the end of 12 weeks, all the baby's organs are formed.  Now that you are pregnant, you will want to do everything you can to have a healthy baby. Two of the most important things are to get good prenatal care and to follow your health care provider's instructions. Prenatal care is all the medical care you receive before the baby's birth. This care will help prevent, find, and treat any problems during the pregnancy and childbirth. BODY CHANGES Your body goes through many changes during pregnancy. The changes vary from woman to woman.   You may gain or lose a couple of pounds at first.  You may feel sick to your stomach (nauseous) and throw up (vomit). If the vomiting is uncontrollable, call your health care provider.  You may tire easily.  You may develop headaches that can be relieved by medicines approved by your health care provider.  You may urinate more often. Painful urination may mean you have a bladder infection.  You may develop heartburn as a result of your pregnancy.  You may develop constipation because certain hormones are causing the muscles that push waste through your intestines to slow down.  You may develop hemorrhoids or swollen, bulging veins (varicose veins).  Your breasts may begin to grow larger and become tender. Your nipples may stick out more, and the tissue that surrounds them (areola) may become darker.  Your gums may bleed and may be sensitive to brushing and flossing.  Dark spots or blotches  (chloasma, mask of pregnancy) may develop on your face. This will likely fade after the baby is born.  Your menstrual periods will stop.  You may have a loss of appetite.  You may develop cravings for certain kinds of food.  You may have changes in your emotions from day to day, such as being excited to be pregnant or being concerned that something may go wrong with the pregnancy and baby.  You may have more vivid and strange dreams.  You may have changes in your hair. These can include thickening of your hair, rapid growth, and changes in texture. Some women also have hair loss during or after pregnancy, or hair that feels dry or thin. Your hair will most likely return to normal after your baby is born. WHAT TO EXPECT AT YOUR PRENATAL VISITS During a routine prenatal visit:  You will be weighed to make sure you and the baby are growing normally.  Your blood pressure will be taken.  Your abdomen will be measured to track your baby's growth.  The fetal heartbeat will be listened to starting around week 10 or 12 of your pregnancy.  Test results from any previous visits will be discussed. Your health care provider may ask you:  How you are feeling.  If you are feeling the baby move.  If you have had any abnormal symptoms, such as leaking fluid, bleeding, severe headaches, or abdominal cramping.  If you have any questions. Other   tests that may be performed during your first trimester include:  Blood tests to find your blood type and to check for the presence of any previous infections. They will also be used to check for low iron levels (anemia) and Rh antibodies. Later in the pregnancy, blood tests for diabetes will be done along with other tests if problems develop.  Urine tests to check for infections, diabetes, or protein in the urine.  An ultrasound to confirm the proper growth and development of the baby.  An amniocentesis to check for possible genetic problems.  Fetal  screens for spina bifida and Down syndrome.  You may need other tests to make sure you and the baby are doing well. HOME CARE INSTRUCTIONS  Medicines  Follow your health care provider's instructions regarding medicine use. Specific medicines may be either safe or unsafe to take during pregnancy.  Take your prenatal vitamins as directed.  If you develop constipation, try taking a stool softener if your health care provider approves. Diet  Eat regular, well-balanced meals. Choose a variety of foods, such as meat or vegetable-based protein, fish, milk and low-fat dairy products, vegetables, fruits, and whole grain breads and cereals. Your health care provider will help you determine the amount of weight gain that is right for you.  Avoid raw meat and uncooked cheese. These carry germs that can cause birth defects in the baby.  Eating four or five small meals rather than three large meals a day may help relieve nausea and vomiting. If you start to feel nauseous, eating a few soda crackers can be helpful. Drinking liquids between meals instead of during meals also seems to help nausea and vomiting.  If you develop constipation, eat more high-fiber foods, such as fresh vegetables or fruit and whole grains. Drink enough fluids to keep your urine clear or pale yellow. Activity and Exercise  Exercise only as directed by your health care provider. Exercising will help you:  Control your weight.  Stay in shape.  Be prepared for labor and delivery.  Experiencing pain or cramping in the lower abdomen or low back is a good sign that you should stop exercising. Check with your health care provider before continuing normal exercises.  Try to avoid standing for long periods of time. Move your legs often if you must stand in one place for a long time.  Avoid heavy lifting.  Wear low-heeled shoes, and practice good posture.  You may continue to have sex unless your health care provider directs you  otherwise. Relief of Pain or Discomfort  Wear a good support bra for breast tenderness.   Take warm sitz baths to soothe any pain or discomfort caused by hemorrhoids. Use hemorrhoid cream if your health care provider approves.   Rest with your legs elevated if you have leg cramps or low back pain.  If you develop varicose veins in your legs, wear support hose. Elevate your feet for 15 minutes, 3-4 times a day. Limit salt in your diet. Prenatal Care  Schedule your prenatal visits by the twelfth week of pregnancy. They are usually scheduled monthly at first, then more often in the last 2 months before delivery.  Write down your questions. Take them to your prenatal visits.  Keep all your prenatal visits as directed by your health care provider. Safety  Wear your seat belt at all times when driving.  Make a list of emergency phone numbers, including numbers for family, friends, the hospital, and police and fire departments. General   Tips  Ask your health care provider for a referral to a local prenatal education class. Begin classes no later than at the beginning of month 6 of your pregnancy.  Ask for help if you have counseling or nutritional needs during pregnancy. Your health care provider can offer advice or refer you to specialists for help with various needs.  Do not use hot tubs, steam rooms, or saunas.  Do not douche or use tampons or scented sanitary pads.  Do not cross your legs for long periods of time.  Avoid cat litter boxes and soil used by cats. These carry germs that can cause birth defects in the baby and possibly loss of the fetus by miscarriage or stillbirth.  Avoid all smoking, herbs, alcohol, and medicines not prescribed by your health care provider. Chemicals in these affect the formation and growth of the baby.  Schedule a dentist appointment. At home, brush your teeth with a soft toothbrush and be gentle when you floss. SEEK MEDICAL CARE IF:   You have  dizziness.  You have mild pelvic cramps, pelvic pressure, or nagging pain in the abdominal area.  You have persistent nausea, vomiting, or diarrhea.  You have a bad smelling vaginal discharge.  You have pain with urination.  You notice increased swelling in your face, hands, legs, or ankles. SEEK IMMEDIATE MEDICAL CARE IF:   You have a fever.  You are leaking fluid from your vagina.  You have spotting or bleeding from your vagina.  You have severe abdominal cramping or pain.  You have rapid weight gain or loss.  You vomit blood or material that looks like coffee grounds.  You are exposed to German measles and have never had them.  You are exposed to fifth disease or chickenpox.  You develop a severe headache.  You have shortness of breath.  You have any kind of trauma, such as from a fall or a car accident. Document Released: 03/29/2001 Document Revised: 08/19/2013 Document Reviewed: 02/12/2013 Exit[employer] Patient Information 2015 [employer]. This information is not intended to replace advice given to you by your health care provider. Make sure you discuss any questions you have with your health care provider.   Nausea & Vomiting  Have saltine crackers or pretzels by your bed and eat a few bites before you raise your head out of bed in the morning  Eat small frequent meals throughout the day instead of large meals  Drink plenty of fluids throughout the day to stay hydrated, just don't drink a lot of fluids with your meals.  This can make your stomach fill up faster making you feel sick  Do not brush your teeth right after you eat  Products with real ginger are good for nausea, like ginger ale and ginger hard candy Make sure it says made with real ginger!  Sucking on sour candy like lemon heads is also good for nausea  If your prenatal vitamins make you nauseated, take them at night so you will sleep through the nausea  Sea Bands  If you feel like you need  medicine for the nausea & vomiting please let us know  If you are unable to keep any fluids or food down please let us know   Constipation  Drink plenty of fluid, preferably water, throughout the day  Eat foods high in fiber such as fruits, vegetables, and grains  Exercise, such as walking, is a good way to keep your bowels regular  Drink warm fluids, especially warm   prune juice, or decaf coffee  Eat a 1/2 cup of real oatmeal (not instant), 1/2 cup applesauce, and 1/2-1 cup warm prune juice every day  If needed, you may take Colace (docusate sodium) stool softener once or twice a day to help keep the stool soft. If you are pregnant, wait until you are out of your first trimester (12-14 weeks of pregnancy)  If you still are having problems with constipation, you may take Miralax once daily as needed to help keep your bowels regular.  If you are pregnant, wait until you are out of your first trimester (12-14 weeks of pregnancy)  Safe Medications in Pregnancy   Acne: Benzoyl Peroxide Salicylic Acid  Backache/Headache: Tylenol: 2 regular strength every 4 hours OR              2 Extra strength every 6 hours  Colds/Coughs/Allergies: Benadryl (alcohol free) 25 mg every 6 hours as needed Breath right strips Claritin Cepacol throat lozenges Chloraseptic throat spray Cold-Eeze- up to three times per day Cough drops, alcohol free Flonase (by prescription only) Guaifenesin Mucinex Robitussin DM (plain only, alcohol free) Saline nasal spray/drops Sudafed (pseudoephedrine) & Actifed ** use only after [redacted] weeks gestation and if you do not have high blood pressure Tylenol Vicks Vaporub Zinc lozenges Zyrtec   Constipation: Colace Ducolax suppositories Fleet enema Glycerin suppositories Metamucil Milk of magnesia Miralax Senokot Smooth move tea  Diarrhea: Kaopectate Imodium A-D  *NO pepto Bismol  Hemorrhoids: Anusol Anusol HC Preparation  H Tucks  Indigestion: Tums Maalox Mylanta Zantac  Pepcid  Insomnia: Benadryl (alcohol free) 25mg every 6 hours as needed Tylenol PM Unisom, no Gelcaps  Leg Cramps: Tums MagGel  Nausea/Vomiting:  Bonine Dramamine Emetrol Ginger extract Sea bands Meclizine  Nausea medication to take during pregnancy:  Unisom (doxylamine succinate 25 mg tablets) Take one tablet daily at bedtime. If symptoms are not adequately controlled, the dose can be increased to a maximum recommended dose of two tablets daily (1/2 tablet in the morning, 1/2 tablet mid-afternoon and one at bedtime). Vitamin B6 100mg tablets. Take one tablet twice a day (up to 200 mg per day).  Skin Rashes: Aveeno products Benadryl cream or 25mg every 6 hours as needed Calamine Lotion 1% cortisone cream  Yeast infection: Gyne-lotrimin 7 Monistat 7   **If taking multiple medications, please check labels to avoid duplicating the same active ingredients **take medication as directed on the label ** Do not exceed 4000 mg of tylenol in 24 hours **Do not take medications that contain aspirin or ibuprofen      

## 2015-06-17 NOTE — Progress Notes (Signed)
  Subjective:    Zoe Savage is a [id number] [redacted]w[redacted]d being seen today for her first obstetrical visit.  Her obstetrical history is significant for smoker and hx of 2 C sections.  .  Pregnancy history fully reviewed.  First CS was for NRFHT at 6 cms, 2nd was repeat (not offered TOLAC).  Thinks she wants a TOLAC.  Discussed smoking cessation strategies, including patches and e cigs.  Declines Quitline referral.   Patient reports some nausea, doing OK.  Filed Vitals:   06/17/15 0835  BP: 110/70  Pulse: 93  Weight: 208 lb (94.348 kg)    HISTORY: OB History  Gravida Para Term Preterm AB SAB TAB Ectopic Multiple Living  3 2 2       2     # Outcome Date GA Lbr Len/2nd Weight Sex Delivery Anes PTL Lv  3 Current           2 Term 01/23/04 [redacted]w[redacted]d   F CS-Unspec Spinal N Y  1 Term 02/20/01 [redacted]w[redacted]d   F CS-Unspec EPI Y Y    Obstetric Comments  NRFHT   Past Medical History  Diagnosis Date  . Fibroids     removed during CS   Past Surgical History  Procedure Laterality Date  . Cesarean section     History reviewed. No pertinent family history.   Exam       Pelvic Exam:    Perineum: Normal Perineum   Vulva: normal   Vagina:  normal mucosa, normal discharge, no palpable nodules   Uterus Normal, Gravid, FH: 7     Cervix: Normal,  Pap collected   Adnexa: Not palpable   Urinary:  urethral meatus normal    System:     Skin: normal coloration and turgor, no rashes    Neurologic: oriented, normal, normal mood   Extremities: normal strength, tone, and muscle mass   HEENT PERRLA   Mouth/Teeth mucous membranes moist, normal dentition   Neck supple and no masses   Cardiovascular: regular rate and rhythm   Respiratory:  appears well, vitals normal, no respiratory distress, acyanotic   Abdomen: soft, non-tender;  FHR: 150          Assessment:    Pregnancy: [id number] Patient Active Problem List   Diagnosis Date Noted  . Supervision of normal pregnancy 06/17/2015  . History of  cesarean delivery 06/17/2015        Plan:     Initial labs drawn. Continue prenatal vitamins  Problem list reviewed and updated  Reviewed n/v relief measures and warning s/s to report  Reviewed recommended weight gain based on pre-gravid BMI  Has already gained 25+ lbs Encouraged well-balanced diet Genetic Screening discussed Integrated Screen: requested.  Ultrasound discussed; fetal survey: requested.  Return in about 5 weeks (around 07/22/2015) for LROB, US:NT+1st IT.  CRESENZO-DISHMAN,Emilene Roma 06/17/2015

## 2015-06-18 LAB — CBC
HEMATOCRIT: 40.3 % (ref 34.0–46.6)
Hemoglobin: 13.9 g/dL (ref 11.1–15.9)
MCH: 31 pg (ref 26.6–33.0)
MCHC: 34.5 g/dL (ref 31.5–35.7)
MCV: 90 fL (ref 79–97)
Platelets: 320 10*3/uL (ref 150–379)
RBC: 4.49 x10E6/uL (ref 3.77–5.28)
RDW: 13 % (ref 12.3–15.4)
WBC: 11.5 10*3/uL — AB (ref 3.4–10.8)

## 2015-06-18 LAB — ANTIBODY SCREEN: ANTIBODY SCREEN: NEGATIVE

## 2015-06-18 LAB — HEPATITIS B SURFACE ANTIGEN: HEP B S AG: NEGATIVE

## 2015-06-18 LAB — VARICELLA ZOSTER ANTIBODY, IGG: Varicella zoster IgG: <135 index — ABNORMAL LOW (ref >165)

## 2015-06-18 LAB — RUBELLA SCREEN: Rubella Antibodies, IGG: 1.74 index (ref >0.99)

## 2015-06-18 LAB — ABO/RH: Rh Factor: POSITIVE

## 2015-06-18 LAB — HIV ANTIBODY (ROUTINE TESTING W REFLEX): HIV Screen 4th Generation wRfx: NONREACTIVE

## 2015-06-18 LAB — RPR: RPR: NONREACTIVE

## 2015-06-18 LAB — CYTOLOGY - PAP

## 2015-06-18 LAB — UNABLE TO VOID

## 2015-07-07 ENCOUNTER — Telehealth: Payer: Self-pay | Admitting: Women's Health

## 2015-07-07 NOTE — Telephone Encounter (Signed)
Pt c/o "really bad sharp pains lower abdomen, some cramping, pain in hips, swelling in feet, no vaginal bleeding." Pt states tylenol and pushing fluids does not help. Pt states she is on her feet a lot at work and she feels this could be some of the cause. Pt offered an appt for today, but states she has an appt in [facility], scheduled an appt for tomorrow for evaluation.

## 2015-07-08 ENCOUNTER — Other Ambulatory Visit: Payer: Self-pay | Admitting: Advanced Practice Midwife

## 2015-07-08 ENCOUNTER — Other Ambulatory Visit (INDEPENDENT_AMBULATORY_CARE_PROVIDER_SITE_OTHER): Payer: Managed Care, Other (non HMO)

## 2015-07-08 ENCOUNTER — Ambulatory Visit (INDEPENDENT_AMBULATORY_CARE_PROVIDER_SITE_OTHER): Payer: Managed Care, Other (non HMO) | Admitting: Obstetrics and Gynecology

## 2015-07-08 ENCOUNTER — Encounter: Payer: Self-pay | Admitting: Advanced Practice Midwife

## 2015-07-08 VITALS — BP 114/66 | HR 88 | Wt 200.0 lb

## 2015-07-08 DIAGNOSIS — Z331 Pregnant state, incidental: Secondary | ICD-10-CM

## 2015-07-08 DIAGNOSIS — Z98891 History of uterine scar from previous surgery: Secondary | ICD-10-CM

## 2015-07-08 DIAGNOSIS — O021 Missed abortion: Secondary | ICD-10-CM

## 2015-07-08 DIAGNOSIS — O3680X0 Pregnancy with inconclusive fetal viability, not applicable or unspecified: Secondary | ICD-10-CM

## 2015-07-08 DIAGNOSIS — Z1389 Encounter for screening for other disorder: Secondary | ICD-10-CM | POA: Diagnosis not present

## 2015-07-08 DIAGNOSIS — Z3491 Encounter for supervision of normal pregnancy, unspecified, first trimester: Secondary | ICD-10-CM

## 2015-07-08 LAB — POCT URINALYSIS DIPSTICK
GLUCOSE UA: NEGATIVE
KETONES UA: NEGATIVE
Leukocytes, UA: NEGATIVE
Nitrite, UA: NEGATIVE
Protein, UA: NEGATIVE

## 2015-07-08 NOTE — Progress Notes (Signed)
[place] TA/TV: 10+3wks by previous us,crl 35.8 mm= 10+1wks,NO FHT,normal rt ov,lt corpus luteal cyst 2.6 x 2.9 x 2.5 cm,pt is seeing Dr. Glo Herring after ultrasound

## 2015-07-08 NOTE — Progress Notes (Signed)
Patient ID: Zoe Savage, female   DOB: 30-Jun-1985, 30 y.o.   MRN: [id number] Preoperative History and Physical  Zoe Savage is a 30 y.o. [id number] here for surgical management of miscarriage at 10 weeks. No significant preoperative concerns.  Proposed surgery: D&C  Past Medical History  Diagnosis Date  . Fibroids     removed during CS  . Depression   . PTSD (post-traumatic stress disorder)    Past Surgical History  Procedure Laterality Date  . Cesarean section     OB History  Gravida Para Term Preterm AB SAB TAB Ectopic Multiple Living  3 2 2       2     # Outcome Date GA Lbr Len/2nd Weight Sex Delivery Anes PTL Lv  3 Current           2 Term 01/23/04 [redacted]w[redacted]d   F CS-Unspec Spinal N Y  1 Term 02/20/01 [redacted]w[redacted]d   F CS-Unspec EPI Y Y    Obstetric Comments  NRFHT  Patient denies any other pertinent gynecologic issues.   Current Outpatient Prescriptions on File Prior to Visit  Medication Sig Dispense Refill  . Prenatal Vit-Fe Fumarate-FA (MULTIVITAMIN-PRENATAL) 27-0.8 MG TABS tablet Take 1 tablet by mouth daily at 12 noon.    . ondansetron (ZOFRAN) 4 MG tablet Take 1 tablet (4 mg total) by mouth daily as needed for nausea or vomiting. (Patient not taking: Reported on 06/17/2015) 20 tablet 1   No current facility-administered medications on file prior to visit.   Allergies  Allergen Reactions  . Morphine And Related     Social History:   reports that she has been smoking Cigarettes.  She has been smoking about 0.50 packs per day. She does not have any smokeless tobacco history on file. She reports that she does not drink alcohol or use illicit drugs.  Family History  Problem Relation Age of Onset  . Arthritis Mother   . Arthritis Father   . Alcohol abuse Father   . Depression Daughter   . Mental illness Maternal Grandmother   . Stroke Maternal Grandmother   . Osteoarthritis Paternal Grandmother   . Emphysema Paternal Grandfather     Review of Systems:  Noncontributory  PHYSICAL EXAM: Blood pressure 114/66, pulse 88, weight 200 lb (90.719 kg), last menstrual period 03/28/2015. Discussion only.  Discussed with pt and her family pt's miscarriage and risks and benefits of D&C. At end of discussion, pt and spouse had opportunity to ask questions and has no further questions at this time.   Greater than 50% was spent in counseling and coordination of care with the patient. Total time greater than: 10 minutes   Labs: Results for orders placed or performed in visit on 07/08/15 (from the past 336 hour(s))  POCT urinalysis dipstick   Collection Time: 07/08/15  1:50 PM  Result Value Ref Range   Color, UA     Clarity, UA     Glucose, UA neg    Bilirubin, UA     Ketones, UA neg    Spec Grav, UA     Blood, UA trace    pH, UA     Protein, UA neg    Urobilinogen, UA     Nitrite, UA neg    Leukocytes, UA Negative Negative    Imaging Studies: US Ob Comp Less 14 Wks  07-02-15  DATING AND VIABILITY SONOGRAM Zoe Savage is a 30 y.o. year old G1P0 with LMP 03/28/2015  which would correlate to  [redacted]w[redacted]d weeks gestation.  She has irregular menstrual cycles.   She is here today for a confirmatory initial sonogram. GESTATION: SINGLETON   FETAL ACTIVITY:          Heart rate         122 bpm        CERVIX: Appears closed ADNEXA: The ovaries are normal. GESTATIONAL AGE AND  BIOMETRICS: Gestational criteria: Estimated Date of Delivery: 01/02/16 by LMP now at [redacted]w[redacted]d Previous Scans:1 (outside ultrasound)    CROWN RUMP LENGTH           6.2 mm         6+2 weeks                                                                      AVERAGE EGA(BY THIS SCAN):  6+2 weeks WORKING EDD( early ultrasound ):  01/31/2016  TECHNICIAN COMMENTS: [place] 6+2 wks single IUP w/ys,pos fht 122 bpm,normal ov's bilat,crl 6.79mm A copy of this report including all images has been saved and backed up to a second source for retrieval if needed. All measures and details of the anatomical  scan, placentation, fluid volume and pelvic anatomy are contained in that report. Zoe Savage 06/09/2015 1:27 PM Clinical Impression and recommendations: I have reviewed the sonogram results above, combined with the patient's current clinical course, below are my impressions and any appropriate recommendations for management based on the sonographic findings. Viable early 70 G1P0 Estimated Date of Delivery: 01/31/16 by today's sonogram Normal general sonographic findings Recommend routine care unless otherwise clinically indicated EURE,LUTHER H 06/10/2015 8:23 PM    Assessment: Patient Active Problem List   Diagnosis Date Noted  . Supervision of normal pregnancy 06/17/2015  . History of cesarean delivery 06/17/2015   Missed abortion, for suction d& C  Plan:  she desired D& C this week., scheduled for Friday 12NOON   .mec 07/08/2015 2:50 PM   By signing my name below, I, Terressa Koyanagi, attest that this documentation has been prepared under the direction and in the presence of Mallory Shirk, MD. Electronically Signed: Terressa Koyanagi, ED Scribe. 07/08/2015. 2:50 PM.   .jfs I personally performed the services described in this documentation, which was SCRIBED in my presence. The recorded information has been reviewed and considered accurate. It has been edited as necessary during review. [name], MD

## 2015-07-08 NOTE — Progress Notes (Signed)
[id number] [redacted]w[redacted]d Estimated Date of Delivery: 01/31/16  Blood pressure 114/66, pulse 88, weight 200 lb (90.719 kg), last menstrual period 03/28/2015.   WORK IN FOR CRAMPING AND "SHARP PAINS" IN PELVIS FOR 2 WEEKS, "GETTING WORSE" Cramps when working and moving around, feels better when resting. No bleeding.  SSE;  Normal appearing dc, cervix closed BP weight and urine results all reviewed and noted.    D/T fetus measuring >10 weeks, pt to see Dr. Glo Herring to discuss plans  Orders Placed This Encounter  Procedures  . US OB Comp Less 14 Wks  . POCT urinalysis dipstick

## 2015-07-09 ENCOUNTER — Other Ambulatory Visit: Payer: Self-pay | Admitting: Obstetrics and Gynecology

## 2015-07-09 ENCOUNTER — Telehealth: Payer: Self-pay | Admitting: *Deleted

## 2015-07-09 ENCOUNTER — Telehealth: Payer: Self-pay | Admitting: Obstetrics and Gynecology

## 2015-07-09 DIAGNOSIS — O021 Missed abortion: Secondary | ICD-10-CM | POA: Insufficient documentation

## 2015-07-09 NOTE — H&P (Signed)
Zoe Savage, CNM at 07/08/2015 1:51 PM     Status: Sign at close encounter       Expand All Collapse All   G3P2002 [redacted]w[redacted]d Estimated Date of Delivery: 01/31/16  Blood pressure 114/66, pulse 88, weight 200 lb (90.719 kg), last menstrual period 03/28/2015.   WORK IN FOR CRAMPING AND "SHARP PAINS" IN PELVIS FOR 2 WEEKS, "GETTING WORSE" Cramps when working and moving around, feels better when resting. No bleeding.  SSE; Normal appearing dc, cervix closed BP weight and urine results all reviewed and noted.    D/T fetus measuring >10 weeks, pt to see Dr. Glo Herring to discuss plans  Orders Placed This Encounter  Procedures  . US OB Comp Less 14 Wks  . POCT urinalysis dipstick                   [name], MD at 07/08/2015 2:38 PM     Status: Signed       Expand All Collapse All   Patient ID: Zoe Savage, female DOB: [birth year], 30 y.o. MRN: [id number] Preoperative History and Physical  Zoe Savage is a 30 y.o. [id number] here for surgical management of miscarriage at 10 weeks. No significant preoperative concerns.  Proposed surgery: D&C  Past Medical History  Diagnosis Date  . Fibroids     removed during CS  . Depression   . PTSD (post-traumatic stress disorder)    Past Surgical History  Procedure Laterality Date  . Cesarean section     OB History  Gravida Para Term Preterm AB SAB TAB Ectopic Multiple Living  3 2 2       2     # Outcome Date GA Lbr Len/2nd Weight Sex Delivery Anes PTL Lv  3 Current           2 Term 01/23/04 [redacted]w[redacted]d   F CS-Unspec Spinal N Y  1 Term 02/20/01 [redacted]w[redacted]d   F CS-Unspec EPI Y Y    Obstetric Comments  NRFHT  Patient denies any other pertinent gynecologic issues.   Current Outpatient Prescriptions on File Prior to Visit  Medication Sig Dispense Refill  . Prenatal Vit-Fe Fumarate-FA  (MULTIVITAMIN-PRENATAL) 27-0.8 MG TABS tablet Take 1 tablet by mouth daily at 12 noon.    . ondansetron (ZOFRAN) 4 MG tablet Take 1 tablet (4 mg total) by mouth daily as needed for nausea or vomiting. (Patient not taking: Reported on 06/17/2015) 20 tablet 1   No current facility-administered medications on file prior to visit.   Allergies  Allergen Reactions  . Morphine And Related     Social History:  reports that she has been smoking Cigarettes. She has been smoking about 0.50 packs per day. She does not have any smokeless tobacco history on file. She reports that she does not drink alcohol or use illicit drugs.  Family History  Problem Relation Age of Onset  . Arthritis Mother   . Arthritis Father   . Alcohol abuse Father   . Depression Daughter   . Mental illness Maternal Grandmother   . Stroke Maternal Grandmother   . Osteoarthritis Paternal Grandmother   . Emphysema Paternal Grandfather     Review of Systems: Noncontributory  PHYSICAL EXAM: Blood pressure 114/66, pulse 88, weight 200 lb (90.719 kg), last menstrual period 03/28/2015. Discussion only.  Discussed with pt and her family pt's miscarriage and risks and benefits of D&C. At end of discussion, pt and spouse had opportunity to ask questions and has no further questions  at this time.   Greater than 50% was spent in counseling and coordination of care with the patient. Total time greater than: 10 minutes   Labs: Results for orders placed or performed in visit on 07/08/15 (from the past 336 hour(s))  POCT urinalysis dipstick   Collection Time: 07/08/15 1:50 PM  Result Value Ref Range   Color, UA     Clarity, UA     Glucose, UA neg    Bilirubin, UA     Ketones, UA neg    Spec Grav, UA     Blood, UA trace    pH, UA     Protein, UA neg    Urobilinogen, UA     Nitrite, UA neg    Leukocytes, UA  Negative Negative    Imaging Studies:  Imaging Results    US Ob Comp Less 14 Wks  06/10/2015 DATING AND VIABILITY SONOGRAM Zoe Savage is a 30 y.o. year old G1P0 with LMP 03/28/2015 which would correlate to [redacted]w[redacted]d weeks gestation. She has irregular menstrual cycles. She is here today for a confirmatory initial sonogram. GESTATION: SINGLETON FETAL ACTIVITY: Heart rate 122 bpm CERVIX: Appears closed ADNEXA: The ovaries are normal. GESTATIONAL AGE AND BIOMETRICS: Gestational criteria: Estimated Date of Delivery: 01/02/16 by LMP now at [redacted]w[redacted]d Previous Scans:1 (outside ultrasound) CROWN RUMP LENGTH  6.2 mm 6+2 weeks AVERAGE EGA(BY THIS SCAN): 6+2 weeks WORKING EDD( early ultrasound ): 01/31/2016 TECHNICIAN COMMENTS: [place] 6+2 wks single IUP w/ys,pos fht 122 bpm,normal ov's bilat,crl 6.44mm A copy of this report including all images has been saved and backed up to a second source for retrieval if needed. All measures and details of the anatomical scan, placentation, fluid volume and pelvic anatomy are contained in that report. Zoe Savage 06/09/2015 1:27 PM Clinical Impression and recommendations: I have reviewed the sonogram results above, combined with the patient's current clinical course, below are my impressions and any appropriate recommendations for management based on the sonographic findings. Viable early 30 G1P0 Estimated Date of Delivery: 01/31/16 by today's sonogram Normal general sonographic findings Recommend routine care unless otherwise clinically indicated EURE,LUTHER H 06/10/2015 8:23 PM     Assessment: Patient Active Problem List   Diagnosis Date Noted  . Supervision of normal pregnancy 06/17/2015  . History of cesarean delivery 06/17/2015   Missed abortion, for suction d& C  Plan: she desired D& C this week., scheduled for Friday 12NOON

## 2015-07-09 NOTE — Telephone Encounter (Signed)
Pt states has discussed with Dr. Glo Herring this am.

## 2015-07-09 NOTE — Telephone Encounter (Signed)
Patient desires D& C tomorrow. Will schedule for NOON tomorrow, pt to arrive at [facility] at 10 am, NPO. Hospital rep [facility]) to call pt to cooordinate . Pt aware to be NPO p [place]

## 2015-07-10 ENCOUNTER — Encounter (HOSPITAL_COMMUNITY): Payer: Self-pay | Admitting: *Deleted

## 2015-07-10 ENCOUNTER — Ambulatory Visit (HOSPITAL_COMMUNITY): Payer: Managed Care, Other (non HMO) | Admitting: Anesthesiology

## 2015-07-10 ENCOUNTER — Encounter (HOSPITAL_COMMUNITY)
Admission: RE | Disposition: A | Discharge status: Home / Self Care | Payer: Self-pay | Source: Ambulatory Visit | Attending: Obstetrics and Gynecology

## 2015-07-10 ENCOUNTER — Ambulatory Visit (HOSPITAL_COMMUNITY)
Admission: RE | Admit: 2015-07-10 | Discharge: 2015-07-10 | Disposition: A | Discharge status: Home / Self Care | Payer: Managed Care, Other (non HMO) | Source: Ambulatory Visit | Attending: Obstetrics and Gynecology | Admitting: Obstetrics and Gynecology

## 2015-07-10 DIAGNOSIS — F4310 Post-traumatic stress disorder, unspecified: Secondary | ICD-10-CM | POA: Insufficient documentation

## 2015-07-10 DIAGNOSIS — O021 Missed abortion: Secondary | ICD-10-CM | POA: Diagnosis not present

## 2015-07-10 DIAGNOSIS — F17200 Nicotine dependence, unspecified, uncomplicated: Secondary | ICD-10-CM | POA: Insufficient documentation

## 2015-07-10 DIAGNOSIS — F329 Major depressive disorder, single episode, unspecified: Secondary | ICD-10-CM | POA: Diagnosis not present

## 2015-07-10 HISTORY — PX: DILATION AND CURETTAGE OF UTERUS: SHX78

## 2015-07-10 LAB — CBC
HCT: 38.9 % (ref 36.0–46.0)
HEMOGLOBIN: 13.5 g/dL (ref 12.0–15.0)
MCH: 31.5 pg (ref 26.0–34.0)
MCHC: 34.7 g/dL (ref 30.0–36.0)
MCV: 90.7 fL (ref 78.0–100.0)
PLATELETS: 319 10*3/uL (ref 150–400)
RBC: 4.29 MIL/uL (ref 3.87–5.11)
RDW: 12.2 % (ref 11.5–15.5)
WBC: 12.6 10*3/uL — AB (ref 4.0–10.5)

## 2015-07-10 SURGERY — DILATION AND CURETTAGE
Anesthesia: General | Site: Vagina

## 2015-07-10 MED ORDER — FENTANYL CITRATE (PF) 100 MCG/2ML IJ SOLN
INTRAMUSCULAR | Status: DC | PRN
  Administered 2015-07-10: 25 ug via INTRAVENOUS
  Administered 2015-07-10: 50 ug via INTRAVENOUS
  Administered 2015-07-10: 25 ug via INTRAVENOUS

## 2015-07-10 MED ORDER — ONDANSETRON HCL 4 MG/2ML IJ SOLN
INTRAMUSCULAR | Status: AC
  Filled 2015-07-10: qty 2

## 2015-07-10 MED ORDER — IBUPROFEN 600 MG PO TABS: 600.0000 mg | ORAL_TABLET | Freq: Four times a day (QID) | ORAL | Status: DC | PRN

## 2015-07-10 MED ORDER — METHYLERGONOVINE MALEATE 0.2 MG/ML IJ SOLN
INTRAMUSCULAR | Status: AC
  Filled 2015-07-10: qty 1

## 2015-07-10 MED ORDER — OXYTOCIN 10 UNIT/ML IJ SOLN
INTRAMUSCULAR | Status: AC
Start: 2015-07-10 — End: 2015-07-10
  Filled 2015-07-10: qty 1

## 2015-07-10 MED ORDER — ONDANSETRON HCL 4 MG/2ML IJ SOLN
4.0000 mg | Freq: Once | INTRAMUSCULAR | Status: AC
  Administered 2015-07-10: 4 mg via INTRAVENOUS

## 2015-07-10 MED ORDER — OXYTOCIN 10 UNIT/ML IJ SOLN
INTRAMUSCULAR | Status: AC
  Filled 2015-07-10: qty 1

## 2015-07-10 MED ORDER — ONDANSETRON HCL 4 MG/2ML IJ SOLN: 4.0000 mg | Freq: Once | INTRAMUSCULAR | Status: DC | PRN

## 2015-07-10 MED ORDER — BUPIVACAINE HCL (PF) 0.5 % IJ SOLN
INTRAMUSCULAR | Status: DC | PRN
  Administered 2015-07-10: 20 mL

## 2015-07-10 MED ORDER — BUPIVACAINE HCL (PF) 0.5 % IJ SOLN
INTRAMUSCULAR | Status: AC
  Filled 2015-07-10: qty 30

## 2015-07-10 MED ORDER — MIDAZOLAM HCL 2 MG/2ML IJ SOLN
INTRAMUSCULAR | Status: AC
  Filled 2015-07-10: qty 2

## 2015-07-10 MED ORDER — MIDAZOLAM HCL 2 MG/2ML IJ SOLN
1.0000 mg | INTRAMUSCULAR | Status: DC | PRN
  Administered 2015-07-10 (×2): 2 mg via INTRAVENOUS
  Filled 2015-07-10: qty 2

## 2015-07-10 MED ORDER — OXYTOCIN 10 UNIT/ML IJ SOLN
40.0000 [IU] | INTRAVENOUS | Status: DC | PRN
  Administered 2015-07-10: 20 [IU] via INTRAVENOUS

## 2015-07-10 MED ORDER — TRAMADOL HCL 50 MG PO TABS: 50.0000 mg | ORAL_TABLET | Freq: Four times a day (QID) | ORAL | Status: DC | PRN

## 2015-07-10 MED ORDER — FENTANYL CITRATE (PF) 100 MCG/2ML IJ SOLN
25.0000 ug | INTRAMUSCULAR | Status: DC | PRN
  Administered 2015-07-10: 50 ug via INTRAVENOUS
  Filled 2015-07-10: qty 2

## 2015-07-10 MED ORDER — PROPOFOL 10 MG/ML IV BOLUS
INTRAVENOUS | Status: AC
  Filled 2015-07-10: qty 20

## 2015-07-10 MED ORDER — FENTANYL CITRATE (PF) 100 MCG/2ML IJ SOLN
INTRAMUSCULAR | Status: AC
  Filled 2015-07-10: qty 2

## 2015-07-10 MED ORDER — PHENYLEPHRINE 40 MCG/ML (10ML) SYRINGE FOR IV PUSH (FOR BLOOD PRESSURE SUPPORT)
PREFILLED_SYRINGE | INTRAVENOUS | Status: AC
  Filled 2015-07-10: qty 10

## 2015-07-10 MED ORDER — LIDOCAINE HCL (PF) 1 % IJ SOLN
INTRAMUSCULAR | Status: AC
  Filled 2015-07-10: qty 5

## 2015-07-10 MED ORDER — PROPOFOL 10 MG/ML IV BOLUS
INTRAVENOUS | Status: DC | PRN
  Administered 2015-07-10: 200 mg via INTRAVENOUS

## 2015-07-10 MED ORDER — 0.9 % SODIUM CHLORIDE (POUR BTL) OPTIME
TOPICAL | Status: DC | PRN
  Administered 2015-07-10: 1000 mL

## 2015-07-10 MED ORDER — PHENYLEPHRINE HCL 10 MG/ML IJ SOLN
INTRAMUSCULAR | Status: DC | PRN
  Administered 2015-07-10 (×3): 80 ug via INTRAVENOUS

## 2015-07-10 MED ORDER — LACTATED RINGERS IV SOLN
INTRAVENOUS | Status: DC
  Administered 2015-07-10 (×2): via INTRAVENOUS

## 2015-07-10 MED ORDER — FENTANYL CITRATE (PF) 100 MCG/2ML IJ SOLN
25.0000 ug | INTRAMUSCULAR | Status: AC
  Administered 2015-07-10 (×2): 25 ug via INTRAVENOUS

## 2015-07-10 SURGICAL SUPPLY — 31 items
BAG HAMPER (MISCELLANEOUS) ×2 IMPLANT
CATH ROBINSON RED A/P 14FR (CATHETERS) IMPLANT
CLOTH BEACON ORANGE TIMEOUT ST (SAFETY) ×2 IMPLANT
COVER LIGHT HANDLE STERIS (MISCELLANEOUS) ×4 IMPLANT
COVER MAYO STAND XLG (DRAPE) ×2 IMPLANT
CURETTE VACUUM 9MM CVD CLR (CANNULA) IMPLANT
DECANTER SPIKE VIAL GLASS SM (MISCELLANEOUS) ×2 IMPLANT
FORMALIN 10 PREFIL 480ML (MISCELLANEOUS) ×2 IMPLANT
GAUZE SPONGE 4X4 16PLY XRAY LF (GAUZE/BANDAGES/DRESSINGS) ×2 IMPLANT
GLOVE BIOGEL PI IND STRL 7.0 (GLOVE) ×1 IMPLANT
GLOVE BIOGEL PI IND STRL 9 (GLOVE) ×1 IMPLANT
GLOVE BIOGEL PI INDICATOR 7.0 (GLOVE) ×1
GLOVE BIOGEL PI INDICATOR 9 (GLOVE) ×1
GLOVE ECLIPSE 9.0 STRL (GLOVE) ×2 IMPLANT
GOWN SPEC L3 XXLG W/TWL (GOWN DISPOSABLE) ×2 IMPLANT
GOWN STRL REUS W/TWL LRG LVL3 (GOWN DISPOSABLE) ×4 IMPLANT
KIT BERKELEY 1ST TRIMESTER 3/8 (MISCELLANEOUS) ×2 IMPLANT
KIT ROOM TURNOVER AP CYSTO (KITS) ×2 IMPLANT
MARKER SKIN DUAL TIP RULER LAB (MISCELLANEOUS) ×2 IMPLANT
NEEDLE SPNL 22GX3.5 QUINCKE BK (NEEDLE) ×2 IMPLANT
NS IRRIG 1000ML POUR BTL (IV SOLUTION) ×2 IMPLANT
PACK BASIC III (CUSTOM PROCEDURE TRAY) ×1
PACK SRG BSC III STRL LF ECLPS (CUSTOM PROCEDURE TRAY) ×1 IMPLANT
PAD ARMBOARD 7.5X6 YLW CONV (MISCELLANEOUS) ×2 IMPLANT
SET BERKELEY SUCTION TUBING (SUCTIONS) ×2 IMPLANT
SYR CONTROL 10ML LL (SYRINGE) ×2 IMPLANT
TOWEL OR 17X26 4PK STRL BLUE (TOWEL DISPOSABLE) ×2 IMPLANT
VACURETTE 10MM (CANNULA) ×2 IMPLANT
VACURETTE 12MM (CANNULA) ×2 IMPLANT
VACURETTE 14MM (CANNULA) IMPLANT
VACURETTE 8MM (CANNULA) IMPLANT

## 2015-07-10 NOTE — Transfer of Care (Signed)
Immediate Anesthesia Transfer of Care Note  Patient: Zoe Savage  Procedure(s) Performed: Procedure(s): SUCTION DILATATION AND CURETTAGE (N/A)  Patient Location: PACU  Anesthesia Type:General  Level of Consciousness: awake, alert  and oriented  Airway & Oxygen Therapy: Patient Spontanous Breathing and Patient connected to face mask oxygen  Post-op Assessment: Report given to RN and Post -op Vital signs reviewed and stable  Post vital signs: Reviewed and stable  Last Vitals:  Filed Vitals:   07/10/15 1030 07/10/15 1035  BP: 92/63 103/59  Pulse:    Temp:    Resp: 16 17    Complications: No apparent anesthesia complications

## 2015-07-10 NOTE — Anesthesia Preprocedure Evaluation (Signed)
Anesthesia Evaluation  Patient identified by MRN, date of birth, ID band Patient awake    Reviewed: Allergy & Precautions, NPO status , Patient's Chart, lab work & pertinent test results  Airway Mallampati: II  TM Distance: >3 FB     Dental  (+) Teeth Intact, Dental Advisory Given   Pulmonary Current Smoker,    breath sounds clear to auscultation       Cardiovascular negative cardio ROS   Rhythm:Regular Rate:Normal     Neuro/Psych PSYCHIATRIC DISORDERS (PTSD) Depression    GI/Hepatic negative GI ROS,   Endo/Other    Renal/GU      Musculoskeletal   Abdominal   Peds  Hematology   Anesthesia Other Findings   Reproductive/Obstetrics                             Anesthesia Physical Anesthesia Plan  ASA: II  Anesthesia Plan: General   Post-op Pain Management:    Induction: Intravenous  Airway Management Planned: LMA  Additional Equipment:   Intra-op Plan:   Post-operative Plan: Extubation in OR  Informed Consent: I have reviewed the patients History and Physical, chart, labs and discussed the procedure including the risks, benefits and alternatives for the proposed anesthesia with the patient or authorized representative who has indicated his/her understanding and acceptance.     Plan Discussed with:   Anesthesia Plan Comments:         Anesthesia Quick Evaluation

## 2015-07-10 NOTE — Brief Op Note (Signed)
07/10/2015  12:43 PM  PATIENT:  Zoe Savage  30 y.o. female  PRE-OPERATIVE DIAGNOSIS:  missed abortion 10 weeks  POST-OPERATIVE DIAGNOSIS:  missed abortion 10-12 weeks  PROCEDURE:  Procedure(s): SUCTION DILATATION AND CURETTAGE (N/A)  SURGEON:  Surgeon(s) and Role:    * [name], MD - Primary  PHYSICIAN ASSISTANT:   ASSISTANTS: Cleda Clarks CST   ANESTHESIA:   local, general and paracervical block, LMA used  EBL:  Total I/O In: 300 [I.V.:300] Out: -   BLOOD ADMINISTERED:none  DRAINS: none   LOCAL MEDICATIONS USED:  MARCAINE    and Amount: 20 ml  SPECIMEN:  Source of Specimen:  Products of conception uterine cavity  DISPOSITION OF SPECIMEN:  PATHOLOGY  COUNTS:  YES  TOURNIQUET:  * No tourniquets in log *  DICTATION: .Dragon Dictation  PLAN OF CARE: Discharge to home after PACU  PATIENT DISPOSITION:  PACU - hemodynamically stable.   Delay start of Pharmacological VTE agent (>24hrs) due to surgical blood loss or risk of bleeding: not applicable Details of procedure: Patient was taken operating room anesthesia introduced with laryngal mask LMA in place, and out conducted after vaginal prepping and draping of the patient with for towels,, then speculum inserted,, cervix grasped with single-tooth tenaculum and uterus sounded in the anteflexed position to 14 cm. Bimanual had confirmed uterine position. Paracervical block with 20 cc of Marcaine was performed Dilation of the endocervical canal to 31 [place] followed, by placement of a 10 mm suction curet curved, with suction under 40 mmHg pressure used in a circumferential fashion removing generous amounts of fluid products of conception and a little bit of blood. He was gradually reduced in size as the procedure progressed and then smooth sharp curettage was performed removing anti-fragments of tissue. Suction curettage briefly confirmed satisfactory uterine cavity evacuation and reduced uterine size. Patient  tolerated procedure well. excellent Hemostasis. \Sponge and needle counts were correct and patient to recovery room in stable condition

## 2015-07-10 NOTE — Anesthesia Postprocedure Evaluation (Signed)
Anesthesia Post Note  Patient: Keyetta Devane Branscome  Procedure(s) Performed: Procedure(s) (LRB): SUCTION DILATATION AND CURETTAGE (N/A)  Patient location during evaluation: PACU Anesthesia Type: General Level of consciousness: awake and alert and oriented Pain management: pain level controlled Vital Signs Assessment: post-procedure vital signs reviewed and stable Respiratory status: spontaneous breathing Cardiovascular status: blood pressure returned to baseline Postop Assessment: no signs of nausea or vomiting Anesthetic complications: no    Last Vitals:  Filed Vitals:   07/10/15 1035 07/10/15 1255  BP: 103/59 108/74  Pulse:  100  Temp:  37.1 C  Resp: 17 17    Last Pain:  Filed Vitals:   07/10/15 1303  PainSc: 3                  Shaquille Murdy

## 2015-07-10 NOTE — Interval H&P Note (Signed)
History and Physical Interval Note:  07/10/2015 11:58 AM  Zoe Savage  has presented today for surgery, with the diagnosis of missed abortion 10 weeks  The various methods of treatment have been discussed with the patient and family. After consideration of risks, benefits and other options for treatment, the patient has consented to  Procedure(s) with comments: [place] (N/A) - Pt is coming at 10:00am for preop (Had labs on 06/17/15) as a surgical intervention .  The patient's history has been reviewed, patient examined, no change in status, stable for surgery.  I have reviewed the patient's chart and labs.  Questions were answered to the patient's satisfaction.     [name]

## 2015-07-10 NOTE — Discharge Instructions (Signed)
Dilation and Curettage or Vacuum Curettage, Care After Refer to this sheet in the next few weeks. These instructions provide you with information on caring for yourself after your procedure. Your health care provider may also give you more specific instructions. Your treatment has been planned according to current medical practices, but problems sometimes occur. Call your health care provider if you have any problems or questions after your procedure. WHAT TO EXPECT AFTER THE PROCEDURE After your procedure, it is typical to have light cramping and bleeding. This may last for 2 days to 2 weeks after the procedure. HOME CARE INSTRUCTIONS   Do not drive for 24 hours.  Wait 1 week before returning to strenuous activities.  Take your temperature 2 times a day for 4 days and write it down. Provide these temperatures to your health care provider if you develop a fever.  Avoid long periods of standing.  Avoid heavy lifting, pushing, or pulling. Do not lift anything heavier than 10 pounds (4.5 kg).  Limit stair climbing to once or twice a day.  Take rest periods often.  You may resume your usual diet.  Drink enough fluids to keep your urine clear or pale yellow.  Your usual bowel function should return. If you have constipation, you may:  Take a mild laxative with permission from your health care provider.  Add fruit and bran to your diet.  Drink more fluids.  Take showers instead of baths until your health care provider gives you permission to take baths.  Do not go swimming or use a hot tub until your health care provider approves.  Try to have someone with you or available to you the first 24-48 hours, especially if you were given a general anesthetic.  Do not douche, use tampons, or have sex (intercourse) for 2 weeks after the procedure.  Only take over-the-counter or prescription medicines as directed by your health care provider. Do not take aspirin. It can cause  bleeding.  Follow up with your health care provider as directed. SEEK MEDICAL CARE IF:   You have increasing cramps or pain that is not relieved with medicine.  You have abdominal pain that does not seem to be related to the same area of earlier cramping and pain.  You have bad smelling vaginal discharge.  You have a rash.  You are having problems with any medicine. SEEK IMMEDIATE MEDICAL CARE IF:   You have bleeding that is heavier than a normal menstrual period.  You have a fever.  You have chest pain.  You have shortness of breath.  You feel dizzy or feel like fainting.  You pass out.  You have pain in your shoulder strap area.  You have heavy vaginal bleeding with or without blood clots. MAKE SURE YOU:   Understand these instructions.  Will watch your condition.  Will get help right away if you are not doing well or get worse.   This information is not intended to replace advice given to you by your health care provider. Make sure you discuss any questions you have with your health care provider.   Document Released: 04/01/2000 Document Revised: 04/09/2013 Document Reviewed: 11/01/2012 Elsevier Interactive Patient Education 2016 [employer]vier Inc.  

## 2015-07-10 NOTE — H&P (View-Only) (Signed)
Zoe Savage, CNM at 07/08/2015 1:51 PM     Status: Sign at close encounter       Expand All Collapse All   G3P2002 [redacted]w[redacted]d Estimated Date of Delivery: 01/31/16  Blood pressure 114/66, pulse 88, weight 200 lb (90.719 kg), last menstrual period 03/28/2015.   WORK IN FOR CRAMPING AND "SHARP PAINS" IN PELVIS FOR 2 WEEKS, "GETTING WORSE" Cramps when working and moving around, feels better when resting. No bleeding.  SSE; Normal appearing dc, cervix closed BP weight and urine results all reviewed and noted.    D/T fetus measuring >10 weeks, pt to see Dr. Glo Herring to discuss plans  Orders Placed This Encounter  Procedures  . US OB Comp Less 14 Wks  . POCT urinalysis dipstick                   Zoe Kind, MD at 07/08/2015 2:38 PM     Status: Signed       Expand All Collapse All   Patient ID: Zoe Savage, female DOB: [birth year], 30 y.o. MRN: [id number] Preoperative History and Physical  Zoe Savage is a 30 y.o. [id number] here for surgical management of miscarriage at 10 weeks. No significant preoperative concerns.  Proposed surgery: D&C  Past Medical History  Diagnosis Date  . Fibroids     removed during CS  . Depression   . PTSD (post-traumatic stress disorder)    Past Surgical History  Procedure Laterality Date  . Cesarean section     OB History  Gravida Para Term Preterm AB SAB TAB Ectopic Multiple Living  3 2 2       2     # Outcome Date GA Lbr Len/2nd Weight Sex Delivery Anes PTL Lv  3 Current           2 Term 01/23/04 [redacted]w[redacted]d   F CS-Unspec Spinal N Y  1 Term 02/20/01 [redacted]w[redacted]d   F CS-Unspec EPI Y Y    Obstetric Comments  NRFHT  Patient denies any other pertinent gynecologic issues.   Current Outpatient Prescriptions on File Prior to Visit  Medication Sig Dispense Refill  . Prenatal Vit-Fe Fumarate-FA  (MULTIVITAMIN-PRENATAL) 27-0.8 MG TABS tablet Take 1 tablet by mouth daily at 12 noon.    . ondansetron (ZOFRAN) 4 MG tablet Take 1 tablet (4 mg total) by mouth daily as needed for nausea or vomiting. (Patient not taking: Reported on 06/17/2015) 20 tablet 1   No current facility-administered medications on file prior to visit.   Allergies  Allergen Reactions  . Morphine And Related     Social History:  reports that she has been smoking Cigarettes. She has been smoking about 0.50 packs per day. She does not have any smokeless tobacco history on file. She reports that she does not drink alcohol or use illicit drugs.  Family History  Problem Relation Age of Onset  . Arthritis Mother   . Arthritis Father   . Alcohol abuse Father   . Depression Daughter   . Mental illness Maternal Grandmother   . Stroke Maternal Grandmother   . Osteoarthritis Paternal Grandmother   . Emphysema Paternal Grandfather     Review of Systems: Noncontributory  PHYSICAL EXAM: Blood pressure 114/66, pulse 88, weight 200 lb (90.719 kg), last menstrual period 03/28/2015. Discussion only.  Discussed with pt and her family pt's miscarriage and risks and benefits of D&C. At end of discussion, pt and spouse had opportunity to ask questions and has no further questions  at this time.   Greater than 50% was spent in counseling and coordination of care with the patient. Total time greater than: 10 minutes   Labs: Results for orders placed or performed in visit on 07/08/15 (from the past 336 hour(s))  POCT urinalysis dipstick   Collection Time: 07/08/15 1:50 PM  Result Value Ref Range   Color, UA     Clarity, UA     Glucose, UA neg    Bilirubin, UA     Ketones, UA neg    Spec Grav, UA     Blood, UA trace    pH, UA     Protein, UA neg    Urobilinogen, UA     Nitrite, UA neg    Leukocytes, UA  Negative Negative    Imaging Studies:  Imaging Results    US Ob Comp Less 14 Wks  06/10/2015 DATING AND VIABILITY SONOGRAM Zoe Savage is a 30 y.o. year old G1P0 with LMP 03/28/2015 which would correlate to [redacted]w[redacted]d weeks gestation. She has irregular menstrual cycles. She is here today for a confirmatory initial sonogram. GESTATION: SINGLETON FETAL ACTIVITY: Heart rate 122 bpm CERVIX: Appears closed ADNEXA: The ovaries are normal. GESTATIONAL AGE AND BIOMETRICS: Gestational criteria: Estimated Date of Delivery: 01/02/16 by LMP now at [redacted]w[redacted]d Previous Scans:1 (outside ultrasound) CROWN RUMP LENGTH  6.2 mm 6+2 weeks AVERAGE EGA(BY THIS SCAN): 6+2 weeks WORKING EDD( early ultrasound ): 01/31/2016 TECHNICIAN COMMENTS: [place] 6+2 wks single IUP w/ys,pos fht 122 bpm,normal ov's bilat,crl 6.98mm A copy of this report including all images has been saved and backed up to a second source for retrieval if needed. All measures and details of the anatomical scan, placentation, fluid volume and pelvic anatomy are contained in that report. Zoe Savage 06/09/2015 1:27 PM Clinical Impression and recommendations: I have reviewed the sonogram results above, combined with the patient's current clinical course, below are my impressions and any appropriate recommendations for management based on the sonographic findings. Viable early 30 G1P0 Estimated Date of Delivery: 01/31/16 by today's sonogram Normal general sonographic findings Recommend routine care unless otherwise clinically indicated Zoe Savage,Zoe Savage 06/10/2015 8:23 PM     Assessment: Patient Active Problem List   Diagnosis Date Noted  . Supervision of normal pregnancy 06/17/2015  . History of cesarean delivery 06/17/2015   Missed abortion, for suction d& C  Plan: she desired D& C this week., scheduled for Friday 12NOON

## 2015-07-10 NOTE — Op Note (Signed)
Please see the brief operative note for surgical details 

## 2015-07-10 NOTE — Anesthesia Procedure Notes (Signed)
Procedure Name: LMA Insertion Date/Time: 07/10/2015 12:13 PM Performed by: Gershon Mussel, Holliday Sheaffer Pre-anesthesia Checklist: Patient identified, Patient being monitored, Timeout performed, Emergency Drugs available and Suction available Patient Re-evaluated:Patient Re-evaluated prior to inductionOxygen Delivery Method: Circle System Utilized Preoxygenation: Pre-oxygenation with 100% oxygen Intubation Type: IV induction Ventilation: Mask ventilation without difficulty LMA Size: 3.0 Placement Confirmation: positive ETCO2 and breath sounds checked- equal and bilateral Tube secured with: Tape Dental Injury: Teeth and Oropharynx as per pre-operative assessment

## 2015-07-10 NOTE — Anesthesia Preprocedure Evaluation (Deleted)
Anesthesia Evaluation    Airway       Dental   Pulmonary Current Smoker,          Cardiovascular     Neuro/Psych    GI/Hepatic   Endo/Other    Renal/GU      Musculoskeletal   Abdominal   Peds  Hematology   Anesthesia Other Findings   Reproductive/Obstetrics                           Anesthesia Physical Anesthesia Plan Anesthesia Quick Evaluation  

## 2015-07-13 ENCOUNTER — Encounter: Payer: Self-pay | Admitting: Obstetrics and Gynecology

## 2015-07-13 ENCOUNTER — Ambulatory Visit (INDEPENDENT_AMBULATORY_CARE_PROVIDER_SITE_OTHER): Payer: Managed Care, Other (non HMO) | Admitting: Obstetrics and Gynecology

## 2015-07-13 ENCOUNTER — Encounter (HOSPITAL_COMMUNITY): Payer: Self-pay | Admitting: Obstetrics and Gynecology

## 2015-07-13 VITALS — BP 120/80 | Ht 63.0 in | Wt 196.0 lb

## 2015-07-13 DIAGNOSIS — O021 Missed abortion: Secondary | ICD-10-CM

## 2015-07-13 DIAGNOSIS — Z09 Encounter for follow-up examination after completed treatment for conditions other than malignant neoplasm: Secondary | ICD-10-CM

## 2015-07-13 MED ORDER — TRAMADOL HCL 50 MG PO TABS: 50.0000 mg | ORAL_TABLET | Freq: Four times a day (QID) | ORAL | Status: DC | PRN

## 2015-07-13 NOTE — Progress Notes (Signed)
Patient ID: Zoe Savage, female   DOB: Jul 01, 1985, 30 y.o.   MRN: [id number] Pt here today for post op visit. Pt states that she is still cramping really bad. Pt states that she still has bleeding as well.

## 2015-07-13 NOTE — Progress Notes (Signed)
Patient ID: Zoe Savage, female   DOB: 1986/01/24, 30 y.o.   MRN: [id number]    Subjective:  Zoe Savage is a 30 y.o. female now 3 days status post [place] .  Pt complains of ongoing abd cramping. Pt reports she is continuing to have vaginal discharge consisting of large clots. Pt reports using ibuprofen and tylenol at home without relief. Pt denies fever, chills.  Review of Systems Negative except abd cramping and vaginal discharge consisting of large clots.    Diet:   NA   Bowel movements : normal.  Pain is not well controlled.  Medications being used: ibuprofen (OTC) and tylenol.  Objective:  BP 120/80 mmHg  Ht 5\' 3"  (1.6 m)  Wt 196 lb (88.905 kg)  BMI 34.73 kg/m2  Breastfeeding? No Presents for discussion only. Discussed with pt post op expectations of suction dilation and curettage and contraceptive management. Also discussed return precautions with patient. At end of discussion, pt had opportunity to ask questions and has no further questions at this time. Greater than 50% was spent in counseling and coordination of care with the patient. Total time greater than: 10 minutes. Assessment:  Post-Op 3 days s/p SUCTION DILATATION AND CURETTAGE    grief response better controlled. Doing well postoperatively.   Plan:  1.Wound care discussed  Expectations for bleeding x 5-10 days reviewed. 2. current medications, Rx for Tramadol.  3. Activity restrictions: none 4. 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 5. Follow up in 2 weeks.  By signing my name below, I, Terressa Koyanagi, attest that this documentation has been prepared under the direction and in the presence of Mallory Shirk, MD. Electronically Signed: Terressa Koyanagi, ED Scribe. 07/13/2015. 3:32 PM.   I personally performed the services described in this documentation, which was SCRIBED in my presence. The recorded information has been reviewed and considered  accurate. It has been edited as necessary during review. [name], MD

## 2015-07-22 ENCOUNTER — Encounter: Payer: Managed Care, Other (non HMO) | Admitting: Women's Health

## 2015-07-22 ENCOUNTER — Other Ambulatory Visit: Payer: Managed Care, Other (non HMO)

## 2015-07-27 ENCOUNTER — Encounter: Payer: Self-pay | Admitting: Obstetrics and Gynecology

## 2015-07-27 ENCOUNTER — Ambulatory Visit: Payer: Managed Care, Other (non HMO) | Admitting: Obstetrics and Gynecology

## 2016-01-11 ENCOUNTER — Emergency Department
Admission: EM | Admit: 2016-01-11 | Discharge: 2016-01-11 | Disposition: A | Discharge status: Home / Self Care | Payer: Managed Care, Other (non HMO) | Attending: Emergency Medicine | Admitting: Emergency Medicine

## 2016-01-11 DIAGNOSIS — H6691 Otitis media, unspecified, right ear: Secondary | ICD-10-CM | POA: Diagnosis not present

## 2016-01-11 DIAGNOSIS — F17210 Nicotine dependence, cigarettes, uncomplicated: Secondary | ICD-10-CM | POA: Diagnosis not present

## 2016-01-11 DIAGNOSIS — H6091 Unspecified otitis externa, right ear: Secondary | ICD-10-CM | POA: Insufficient documentation

## 2016-01-11 DIAGNOSIS — H9201 Otalgia, right ear: Secondary | ICD-10-CM | POA: Diagnosis present

## 2016-01-11 MED ORDER — AMOXICILLIN 875 MG PO TABS: 875.0000 mg | ORAL_TABLET | Freq: Two times a day (BID) | ORAL | 0 refills | Status: DC

## 2016-01-11 MED ORDER — NEOMYCIN-POLYMYXIN-HC 3.5-10000-1 OT SOLN: 3.0000 [drp] | Freq: Four times a day (QID) | OTIC | 0 refills | Status: AC

## 2016-01-11 NOTE — ED Triage Notes (Signed)
Pt c/o right ear pain for the past week.

## 2016-01-11 NOTE — ED Provider Notes (Signed)
[facility] Emergency Department Provider Note  ____________________________________________  Time seen: Approximately 5:52 PM  I have reviewed the triage vital signs and the nursing notes.   HISTORY  Chief Complaint Otalgia    HPI Zoe Savage is a 30 y.o. female who presents to emergency department complaining of sharp right ear pain. Patient states that symptoms have been ongoing for 1 week. They have been worsening. Patient denies any headache, visual changes, nasal congestion, sore throat, chest pain, shortness of breath, nausea or vomiting. Patient states that the pain is worse with palpation or laying on right ear. She states that when she chews the pain in her ear increases. She is not trying medications prior to arrival. She denies any barotrauma. She denies inserting any objects into her ear.   Past Medical History:  Diagnosis Date  . Depression   . Fibroids    removed during CS  . PTSD (post-traumatic stress disorder)     Patient Active Problem List   Diagnosis Date Noted  . Postop check d&C 07/13/2015  . Missed abortion 07/09/2015  . History of cesarean delivery 06/17/2015    Past Surgical History:  Procedure Laterality Date  . CESAREAN SECTION    . DILATION AND CURETTAGE OF UTERUS N/A 07/10/2015   Procedure: SUCTION DILATATION AND CURETTAGE;  Surgeon: [name], MD;  Location: [facility];  Service: Gynecology;  Laterality: N/A;    Prior to Admission medications   Medication Sig Start Date End Date Taking? Authorizing Provider  acetaminophen (TYLENOL) 325 MG tablet Take 650 mg by mouth every 6 (six) hours as needed.    Historical Provider, MD  amoxicillin (AMOXIL) 875 MG tablet Take 1 tablet (875 mg total) by mouth 2 (two) times daily. 01/11/16   Charline Bills Cuthriell, PA-C  ibuprofen (ADVIL,MOTRIN) 200 MG tablet Take 200 mg by mouth every 6 (six) hours as needed.    Historical Provider, MD  neomycin-polymyxin-hydrocortisone  (CORTISPORIN) otic solution Place 3 drops into the right ear 4 (four) times daily. 01/11/16 01/21/16  Roderic Palau D Cuthriell, PA-C  traMADol (ULTRAM) 50 MG tablet Take 1 tablet (50 mg total) by mouth every 6 (six) hours as needed for moderate pain or severe pain. 07/13/15   [name], MD    Allergies Morphine and related  Family History  Problem Relation Age of Onset  . Arthritis Mother   . Arthritis Father   . Alcohol abuse Father   . Depression Daughter   . Mental illness Maternal Grandmother   . Stroke Maternal Grandmother   . Osteoarthritis Paternal Grandmother   . Emphysema Paternal Grandfather     Social History Social History  Substance Use Topics  . Smoking status: Current Every Day Smoker    Packs/day: 0.50    Types: Cigarettes  . Smokeless tobacco: Never Used  . Alcohol use No     Review of Systems  Constitutional: No fever/chills Eyes: No visual changes. No discharge ENT: Positive for sharp right ear pain. Cardiovascular: no chest pain. Respiratory: no cough. No SOB. Gastrointestinal: No abdominal pain.  No nausea, no vomiting.  Musculoskeletal: Negative for musculoskeletal pain. Skin: Negative for rash, abrasions, lacerations, ecchymosis. Neurological: Negative for headaches, focal weakness or numbness. 10-point ROS otherwise negative.  ____________________________________________   PHYSICAL EXAM:  VITAL SIGNS: ED Triage Vitals  Enc Vitals Group     BP 01/11/16 1706 123/76     Pulse Rate 01/11/16 1706 86     Resp 01/11/16 1706 18  Temp 01/11/16 1706 98.2 F (36.8 C)     Temp Source 01/11/16 1706 Oral     SpO2 01/11/16 1706 99 %     Weight 01/11/16 1707 190 lb (86.2 kg)     Height 01/11/16 1707 5\' 3"  (1.6 m)     Head Circumference --      Peak Flow --      Pain Score 01/11/16 1709 7     Pain Loc --      Pain Edu? --      Excl. in [place]? --      Constitutional: Alert and oriented. Well appearing and in no acute distress. Eyes:  Conjunctivae are normal. PERRL. EOMI. Head: Atraumatic. ENT:      Ears: GC and TM on left is unremarkable. ECG on right is very edematous and erythematous. Small portion of TM is visualized in the 11:00 position. Other portion of TM is nonvisualized due to EAC edema. What is visualized of TM is erythematous, and bulging. No bloody drainage.      Nose: No congestion/rhinnorhea.      Mouth/Throat: Mucous membranes are moist.  Neck: No stridor.   Hematological/Lymphatic/Immunilogical: No cervical lymphadenopathy. Cardiovascular: Normal rate, regular rhythm. Normal S1 and S2.  Good peripheral circulation. Respiratory: Normal respiratory effort without tachypnea or retractions. Lungs CTAB. Good air entry to the bases with no decreased or absent breath sounds. Musculoskeletal: Full range of motion to all extremities. No gross deformities appreciated. Neurologic:  Normal speech and language. No gross focal neurologic deficits are appreciated.  Skin:  Skin is warm, dry and intact. No rash noted. Psychiatric: Mood and affect are normal. Speech and behavior are normal. Patient exhibits appropriate insight and judgement.   ____________________________________________   LABS (all labs ordered are listed, but only abnormal results are displayed)  Labs Reviewed - No data to display ____________________________________________  EKG   ____________________________________________  RADIOLOGY   No results found.  ____________________________________________    PROCEDURES  Procedure(s) performed:    Procedures    Medications - No data to display   ____________________________________________   INITIAL IMPRESSION / ASSESSMENT AND PLAN / ED COURSE  Pertinent labs & imaging results that were available during my care of the patient were reviewed by me and considered in my medical decision making (see chart for details).  Review of the [place] CSRS was performed in accordance of the [place]  prior to dispensing any controlled drugs.  Clinical Course    Patient's diagnosis is consistent with Otitis media and otitis externa to the right ear. TM is not well visualized but what is visualized is erythematous and bulging. Patient is advised to follow-up with primary care or urgent care in 3-4 days after antibiotic use to reevaluate TM.Marland Kitchen Patient will be discharged home with prescriptions for antibiotic eardrops as well as oral antibiotics.  Patient is given ED precautions to return to the ED for any worsening or new symptoms.     ____________________________________________  FINAL CLINICAL IMPRESSION(S) / ED DIAGNOSES  Final diagnoses:  Otitis externa, right  Acute right otitis media, recurrence not specified, unspecified otitis media type      NEW MEDICATIONS STARTED DURING THIS VISIT:  New Prescriptions   AMOXICILLIN (AMOXIL) 875 MG TABLET    Take 1 tablet (875 mg total) by mouth 2 (two) times daily.   NEOMYCIN-POLYMYXIN-HYDROCORTISONE (CORTISPORIN) OTIC SOLUTION    Place 3 drops into the right ear 4 (four) times daily.        This chart was dictated  using voice recognition software/Dragon. Despite best efforts to proofread, errors can occur which can change the meaning. Any change was purely unintentional.    Darletta Moll, PA-C 01/11/16 1757    Lavonia Drafts, MD 01/11/16 2022

## 2016-02-11 ENCOUNTER — Encounter: Payer: Self-pay | Admitting: Emergency Medicine

## 2016-02-11 ENCOUNTER — Emergency Department
Admission: EM | Admit: 2016-02-11 | Discharge: 2016-02-11 | Disposition: A | Discharge status: Home / Self Care | Payer: Managed Care, Other (non HMO) | Attending: Emergency Medicine | Admitting: Emergency Medicine

## 2016-02-11 DIAGNOSIS — O4690 Antepartum hemorrhage, unspecified, unspecified trimester: Secondary | ICD-10-CM

## 2016-02-11 DIAGNOSIS — F17210 Nicotine dependence, cigarettes, uncomplicated: Secondary | ICD-10-CM | POA: Insufficient documentation

## 2016-02-11 DIAGNOSIS — O99331 Smoking (tobacco) complicating pregnancy, first trimester: Secondary | ICD-10-CM | POA: Diagnosis not present

## 2016-02-11 DIAGNOSIS — O209 Hemorrhage in early pregnancy, unspecified: Secondary | ICD-10-CM | POA: Insufficient documentation

## 2016-02-11 DIAGNOSIS — Z791 Long term (current) use of non-steroidal anti-inflammatories (NSAID): Secondary | ICD-10-CM | POA: Diagnosis not present

## 2016-02-11 DIAGNOSIS — Z3A08 8 weeks gestation of pregnancy: Secondary | ICD-10-CM | POA: Insufficient documentation

## 2016-02-11 DIAGNOSIS — R102 Pelvic and perineal pain: Secondary | ICD-10-CM | POA: Diagnosis not present

## 2016-02-11 LAB — HCG, QUANTITATIVE, PREGNANCY: hCG, Beta Chain, Quant, S: 4 m[IU]/mL (ref <5)

## 2016-02-11 NOTE — Discharge Instructions (Signed)
As we discussed, your blood pregnancy test (hCG) level is only 4.  It is incredibly unlikely that anything would be visualized on ultrasound, so we agreed to defer at this time.  However, we do recommend that you follow-up early next week with an OB/GYN doctor such as Dr. Kenton Kingfisher or one of his colleagues to have some repeat lab work drawn (another hCG level) to make sure that the level was going down and not rising as one would expect with an early pregnancy.  Your blood type is O+ so you did not receive RhoGAM today.    Return to the emergency department if you develop new or worsening symptoms that concern you.

## 2016-02-11 NOTE — ED Notes (Signed)
Pt. Verbalizes understanding of d/c instructions, and follow-up. VS stable and pt. Denies pain.  Pt. In NAD at time of d/c and denies further concerns regarding this visit. Pt. Stable at the time of departure from the unit, departing unit by the safest and most appropriate manner per that pt condition and limitations. Pt advised to return to the ED at any time for emergent concerns, or for new/worsening symptoms.

## 2016-02-11 NOTE — ED Provider Notes (Signed)
[facility] Emergency Department Provider Note  ____________________________________________   First MD Initiated Contact with Patient 02/11/16 1447     (approximate)  I have reviewed the triage vital signs and the nursing notes.   HISTORY  Chief Complaint Vaginal Bleeding    HPI Zoe Savage is a 30 y.o. female G4P2 with a prior early miscarriage 78 monthsago who presents with about 5 days of mild vaginal bleeding in the setting of about 6 days of positive home urine pregnancy test.  She states that her last menstrual period was about 6 weeks ago but that she is extremely irregular and unpredictable.  She had multiple home urine pregnancy tests starting about 6 or 7 days ago which were positive, but then about 4 or 5 days ago she began having vaginal bleeding.  It was initially moderate like a regular period but is now much better and has almost completely stopped, but because of the positive pregnancy test she wanted to be evaluated.  She is having mild intermittent suprapubic cramping but no significant pain.  She denies vomiting although she has had some mild intermittent nausea.  Nothing in particular makes the symptoms worse and they are getting better over time.  She denies fever/chills, chest pain, shortness of breath, and any other abdominal pain.  She has not been to an OB/GYN recently for further evaluation.  Past Medical History:  Diagnosis Date  . Depression   . Fibroids    removed during CS  . PTSD (post-traumatic stress disorder)     Patient Active Problem List   Diagnosis Date Noted  . Postop check d&C 07/13/2015  . Missed abortion 07/09/2015  . History of cesarean delivery 06/17/2015    Past Surgical History:  Procedure Laterality Date  . CESAREAN SECTION    . DILATION AND CURETTAGE OF UTERUS N/A 07/10/2015   Procedure: SUCTION DILATATION AND CURETTAGE;  Surgeon: [name], MD;  Location: [facility];  Service: Gynecology;   Laterality: N/A;    Prior to Admission medications   Medication Sig Start Date End Date Taking? Authorizing Provider  acetaminophen (TYLENOL) 325 MG tablet Take 650 mg by mouth every 6 (six) hours as needed.    Historical Provider, MD  amoxicillin (AMOXIL) 875 MG tablet Take 1 tablet (875 mg total) by mouth 2 (two) times daily. 01/11/16   Charline Bills Cuthriell, PA-C  ibuprofen (ADVIL,MOTRIN) 200 MG tablet Take 200 mg by mouth every 6 (six) hours as needed.    Historical Provider, MD  traMADol (ULTRAM) 50 MG tablet Take 1 tablet (50 mg total) by mouth every 6 (six) hours as needed for moderate pain or severe pain. 07/13/15   [name], MD    Allergies Morphine and related  Family History  Problem Relation Age of Onset  . Arthritis Mother   . Arthritis Father   . Alcohol abuse Father   . Depression Daughter   . Mental illness Maternal Grandmother   . Stroke Maternal Grandmother   . Osteoarthritis Paternal Grandmother   . Emphysema Paternal Grandfather     Social History Social History  Substance Use Topics  . Smoking status: Current Every Day Smoker    Packs/day: 0.50    Types: Cigarettes  . Smokeless tobacco: Never Used  . Alcohol use No    Review of Systems Constitutional: No fever/chills Eyes: No visual changes. ENT: No sore throat. Cardiovascular: Denies chest pain. Respiratory: Denies shortness of breath. Gastrointestinal: Mild suprapubic cramping .  Nausea,  no vomiting.  No diarrhea.  No constipation. Genitourinary: Negative for dysuria. +vaginal bleeding w/ positive pregnancy test Musculoskeletal: Negative for back pain. Skin: Negative for rash. Neurological: Negative for headaches, focal weakness or numbness.  10-point ROS otherwise negative.  ____________________________________________   PHYSICAL EXAM:  VITAL SIGNS: ED Triage Vitals [02/11/16 1148]  Enc Vitals Group     BP 124/75     Pulse Rate 74     Resp 20     Temp 98.4 F (36.9 C)     Temp  Source Oral     SpO2 100 %     Weight 200 lb (90.7 kg)     Height      Head Circumference      Peak Flow      Pain Score 0     Pain Loc      Pain Edu?      Excl. in [place]?     Constitutional: Alert and oriented. Well appearing and in no acute distress. Eyes: Conjunctivae are normal. PERRL. EOMI. Head: Atraumatic. Nose: No congestion/rhinnorhea. Mouth/Throat: Mucous membranes are moist.  Oropharynx non-erythematous. Neck: No stridor.  No meningeal signs.   Cardiovascular: Normal rate, regular rhythm. Good peripheral circulation. Grossly normal heart sounds. Respiratory: Normal respiratory effort.  No retractions. Lungs CTAB. Gastrointestinal: Soft and nontender. No distention.  GU:  Deferred at patient request Musculoskeletal: No lower extremity tenderness nor edema. No gross deformities of extremities. Neurologic:  Normal speech and language. No gross focal neurologic deficits are appreciated.  Skin:  Skin is warm, dry and intact. No rash noted. Psychiatric: Mood and affect are normal. Speech and behavior are normal.  ____________________________________________   LABS (all labs ordered are listed, but only abnormal results are displayed)  Labs Reviewed  HCG, QUANTITATIVE, PREGNANCY   ____________________________________________  EKG  None - EKG not ordered by ED physician ____________________________________________  RADIOLOGY   No results found.  ____________________________________________   PROCEDURES  Procedure(s) performed:   Procedures   Critical Care performed: No ____________________________________________   INITIAL IMPRESSION / ASSESSMENT AND PLAN / ED COURSE  Pertinent labs & imaging results that were available during my care of the patient were reviewed by me and considered in my medical decision making (see chart for details).  The patient was O+ 7 months ago on her labs which is consistent with what she remembered about her blood type.   There is no indication for RhoGAM.  Her hCG was only 4.  Her bleeding has almost stopped at this point and she has normal vital signs.  We had an extensive conversation about the probability of early miscarriage in the setting of the incredibly low hCG.  I encouraged her to follow up with OB/GYN for a repeat HCG in about 3 or 4 days.  We had a long discussion about pelvic exam and ultrasound, but she does not want either of them if they are not necessary and I explained that since the bleeding is almost stopped and the hCG was only 4, is unlikely that either would be particularly helpful in the evaluation.  She understands and agrees with the plan for outpatient follow-up.  I gave my usual and customary return precautions.     ____________________________________________  FINAL CLINICAL IMPRESSION(S) / ED DIAGNOSES  Final diagnoses:  Vaginal bleeding in pregnancy     MEDICATIONS GIVEN DURING THIS VISIT:  Medications - No data to display   NEW OUTPATIENT MEDICATIONS STARTED DURING THIS VISIT:  New Prescriptions   No medications  on file    Modified Medications   No medications on file    Discontinued Medications   No medications on file     Note:  This document was prepared using Dragon voice recognition software and may include unintentional dictation errors.    Hinda Kehr, MD 02/11/16 [phone]

## 2016-02-11 NOTE — ED Triage Notes (Signed)
Pt to ed with c/o vaginal bleeding since Tuesday.  Pt denies abd pain.  Reports she is approx [redacted] weeks pregnant.  States bleeding is better today than on Tuesday.

## 2016-08-04 ENCOUNTER — Emergency Department
Admission: EM | Admit: 2016-08-04 | Discharge: 2016-08-04 | Disposition: A | Discharge status: Home / Self Care | Payer: Managed Care, Other (non HMO) | Attending: Emergency Medicine | Admitting: Emergency Medicine

## 2016-08-04 ENCOUNTER — Emergency Department: Payer: Managed Care, Other (non HMO)

## 2016-08-04 DIAGNOSIS — F17210 Nicotine dependence, cigarettes, uncomplicated: Secondary | ICD-10-CM | POA: Diagnosis not present

## 2016-08-04 DIAGNOSIS — M25561 Pain in right knee: Secondary | ICD-10-CM | POA: Diagnosis present

## 2016-08-04 MED ORDER — HYDROCODONE-ACETAMINOPHEN 5-325 MG PO TABS
1.0000 | ORAL_TABLET | ORAL | Status: AC
  Administered 2016-08-04: 1 via ORAL

## 2016-08-04 MED ORDER — HYDROCODONE-ACETAMINOPHEN 5-325 MG PO TABS: 1.0000 | ORAL_TABLET | Freq: Four times a day (QID) | ORAL | 0 refills | Status: DC | PRN

## 2016-08-04 MED ORDER — PREDNISONE 10 MG PO TABS: 10.0000 mg | ORAL_TABLET | Freq: Every day | ORAL | 0 refills | Status: DC

## 2016-08-04 MED ORDER — HYDROCODONE-ACETAMINOPHEN 5-325 MG PO TABS
ORAL_TABLET | ORAL | Status: AC
  Administered 2016-08-04: 1 via ORAL
  Filled 2016-08-04: qty 1

## 2016-08-04 NOTE — ED Notes (Signed)
Pt denies numbness and tingling to right leg post ace wrap application.

## 2016-08-04 NOTE — ED Triage Notes (Signed)
Pt c/o right knee pain for the past 2-3 days denies injury

## 2016-08-04 NOTE — ED Provider Notes (Signed)
[facility] Provider Note   CSN: [id number] Arrival date & time: 08/04/16  1846     History   Chief Complaint Chief Complaint  Patient presents with  . Knee Pain    HPI Zoe Savage is a 31 y.o. female presents to the emergency department for evaluation of right knee pain. Pain is been present for 3 days. She denies any trauma or injury. Patient is forming lots of walking over the last few days. She states she walks 20 miles a day. She has been feeling some catching and grinding within the right anterior knee. She feels tightness throughout the posterior and anterior knee. Her pain is diffuse she describes as sharp and increased with weightbearing activities. She denies any lower leg swelling.   HPI  Past Medical History:  Diagnosis Date  . Depression   . Fibroids    removed during CS  . PTSD (post-traumatic stress disorder)     Patient Active Problem List   Diagnosis Date Noted  . Postop check d&C 07/13/2015  . Missed abortion 07/09/2015  . History of cesarean delivery 06/17/2015    Past Surgical History:  Procedure Laterality Date  . CESAREAN SECTION    . DILATION AND CURETTAGE OF UTERUS N/A 07/10/2015   Procedure: SUCTION DILATATION AND CURETTAGE;  Surgeon: [name], MD;  Location: [facility];  Service: Gynecology;  Laterality: N/A;    OB History    Gravida Para Term Preterm AB Living   4 2 2   1 2    SAB TAB Ectopic Multiple Live Births   1       2      Obstetric Comments   NRFHT       Home Medications    Prior to Admission medications   Medication Sig Start Date End Date Taking? Authorizing Provider  acetaminophen (TYLENOL) 325 MG tablet Take 650 mg by mouth every 6 (six) hours as needed.    Historical Provider, MD  amoxicillin (AMOXIL) 875 MG tablet Take 1 tablet (875 mg total) by mouth 2 (two) times daily. 01/11/16   Charline Bills Cuthriell, PA-C  HYDROcodone-acetaminophen (NORCO) 5-325 MG tablet Take 1 tablet by mouth every 6  (six) hours as needed for moderate pain. 08/04/16   Duanne Guess, PA-C  ibuprofen (ADVIL,MOTRIN) 200 MG tablet Take 200 mg by mouth every 6 (six) hours as needed.    Historical Provider, MD  predniSONE (DELTASONE) 10 MG tablet Take 1 tablet (10 mg total) by mouth daily. 6,5,4,3,2,1 six day taper 08/04/16   Duanne Guess, PA-C  traMADol (ULTRAM) 50 MG tablet Take 1 tablet (50 mg total) by mouth every 6 (six) hours as needed for moderate pain or severe pain. 07/13/15   [name], MD    Family History Family History  Problem Relation Age of Onset  . Arthritis Mother   . Arthritis Father   . Alcohol abuse Father   . Depression Daughter   . Mental illness Maternal Grandmother   . Stroke Maternal Grandmother   . Osteoarthritis Paternal Grandmother   . Emphysema Paternal Grandfather     Social History Social History  Substance Use Topics  . Smoking status: Current Every Day Smoker    Packs/day: 0.50    Types: Cigarettes  . Smokeless tobacco: Never Used  . Alcohol use No     Allergies   Morphine and related   Review of Systems Review of Systems  Constitutional: Negative for activity change, chills, fatigue and fever.  HENT: Negative for congestion, sinus pressure and sore throat.   Eyes: Negative for visual disturbance.  Respiratory: Negative for cough, chest tightness and shortness of breath.   Cardiovascular: Negative for chest pain and leg swelling.  Gastrointestinal: Negative for abdominal pain, diarrhea, nausea and vomiting.  Genitourinary: Negative for dysuria.  Musculoskeletal: Positive for arthralgias, gait problem and joint swelling.  Skin: Negative for rash.  Neurological: Negative for weakness, numbness and headaches.  Hematological: Negative for adenopathy.  Psychiatric/Behavioral: Negative for agitation, behavioral problems and confusion.     Physical Exam Updated Vital Signs BP 129/78 (BP Location: Left Arm)   Pulse 90   Temp 98.5 F (36.9 C)  (Oral)   Resp 16   Ht 5\' 3"  (1.6 m)   Wt 90.7 kg   LMP 07/25/2016 (Exact Date)   SpO2 97%   BMI 35.43 kg/m   Physical Exam  Constitutional: She is oriented to person, place, and time. She appears well-developed and well-nourished. No distress.  HENT:  Head: Normocephalic and atraumatic.  Mouth/Throat: Oropharynx is clear and moist.  Eyes: EOM are normal. Pupils are equal, round, and reactive to light. Right eye exhibits no discharge. Left eye exhibits no discharge.  Neck: Normal range of motion. Neck supple.  Cardiovascular: Normal rate, regular rhythm and intact distal pulses.   Pulmonary/Chest: Effort normal. No respiratory distress.  Abdominal: She exhibits no distension.  Musculoskeletal:  Examination of the right lower extremity shows patient has good range of motion of the hip with internal and external rotation. She has 0-90 range of motion of the right knee. She has severe pain with hyperflexion. She has a small effusion with positive Baker cyst. There is no warmth erythema or edema throughout the lower extremity. Nontender throughout the calf with compression. Knee is stable to valgus and varus stress testing. Positive McMurray's test. She is able to straight leg raise. Sensation is intact distally.  Neurological: She is alert and oriented to person, place, and time. She has normal reflexes.  Skin: Skin is warm and dry.  Psychiatric: She has a normal mood and affect. Her behavior is normal. Thought content normal.     ED Treatments / Results  Labs (all labs ordered are listed, but only abnormal results are displayed) Labs Reviewed - No data to display  EKG  EKG Interpretation None       Radiology Dg Knee Complete 4 Views Right  Result Date: 08/04/2016 CLINICAL DATA:  Right knee pain for the past 2-3 days. EXAM: RIGHT KNEE - COMPLETE 4+ VIEW COMPARISON:  None. FINDINGS: No evidence of fracture, dislocation, or joint effusion. No evidence of arthropathy or other  focal bone abnormality. Soft tissues are unremarkable. IMPRESSION: Normal examination. Electronically Signed   By: Claudie Revering M.D.   On: 08/04/2016 19:41    Procedures Procedures (including critical care time) SPLINT APPLICATION Date/Time: 6:64 PM Authorized by: Feliberto Gottron Consent: Verbal consent obtained. Risks and benefits: risks, benefits and alternatives were discussed Consent given by: patient Splint applied by:  Ace wrap Location details: Right knee  Splint type: Ace wrap,  Supplies used: Ace wrap crutches  Post-procedure: The splinted body part was neurovascularly unchanged following the procedure. Patient tolerance: Patient tolerated the procedure well with no immediate complications.     Medications Ordered in ED Medications  HYDROcodone-acetaminophen (NORCO/VICODIN) 5-325 MG per tablet 1 tablet (1 tablet Oral Given 08/04/16 2040)     Initial Impression / Assessment and Plan / ED Course  I have reviewed the  triage vital signs and the nursing notes.  Pertinent labs & imaging results that were available during my care of the patient were reviewed by me and considered in my medical decision making (see chart for details).     31 year old female with right knee pain for 2-3 days. No trauma or injury. X-rays were negative for the right knee show no significant arthritis. There is concern for mechanical knee pain, internal derangement. Patient will use crutches, rest ice and elevate the knee. She will follow-up with orthopedics. She is educated on signs and symptoms return to the ED for fever  Final Clinical Impressions(s) / ED Diagnoses   Final diagnoses:  Acute pain of right knee  Mechanical knee pain, right    New Prescriptions New Prescriptions   HYDROCODONE-ACETAMINOPHEN (NORCO) 5-325 MG TABLET    Take 1 tablet by mouth every 6 (six) hours as needed for moderate pain.   PREDNISONE (DELTASONE) 10 MG TABLET    Take 1 tablet (10 mg total) by mouth  daily. 6,5,4,3,2,1 six day taper     Duanne Guess, PA-C 08/04/16 2056    Harvest Dark, MD 08/04/16 [phone]

## 2016-08-04 NOTE — ED Notes (Signed)
See triage note, pt reports intense pain to right knee. Pt denies inj to knee. Pt also reports swelling to right knee. Pt tearful on inspection, emotional supports given to pt. Pulses intact.

## 2016-08-04 NOTE — Discharge Instructions (Signed)
Please rest ice and elevate the right knee. Take prednisone as prescribed. Use Norco as needed for severe pain. Use crutches to help with ambulation. Follow-up with orthopedics in 1 week. Return to the ER for any fevers increasing pain worsening symptoms or urgent changes in her health.

## 2016-08-04 NOTE — ED Notes (Signed)
Patient transported to X-ray 

## 2016-08-24 IMAGING — CR DG CHEST 2V
1 series · 2 of 2 positions shown · non-contrast
Comparison: None.

CLINICAL DATA: Left-sided chest pain, acute

EXAM:
CHEST  2 VIEW

[Series 1: dxr chest pa (or ap) and lateral · 0.14mm/px · 2 of 2 slices shown]
[im 1/2]
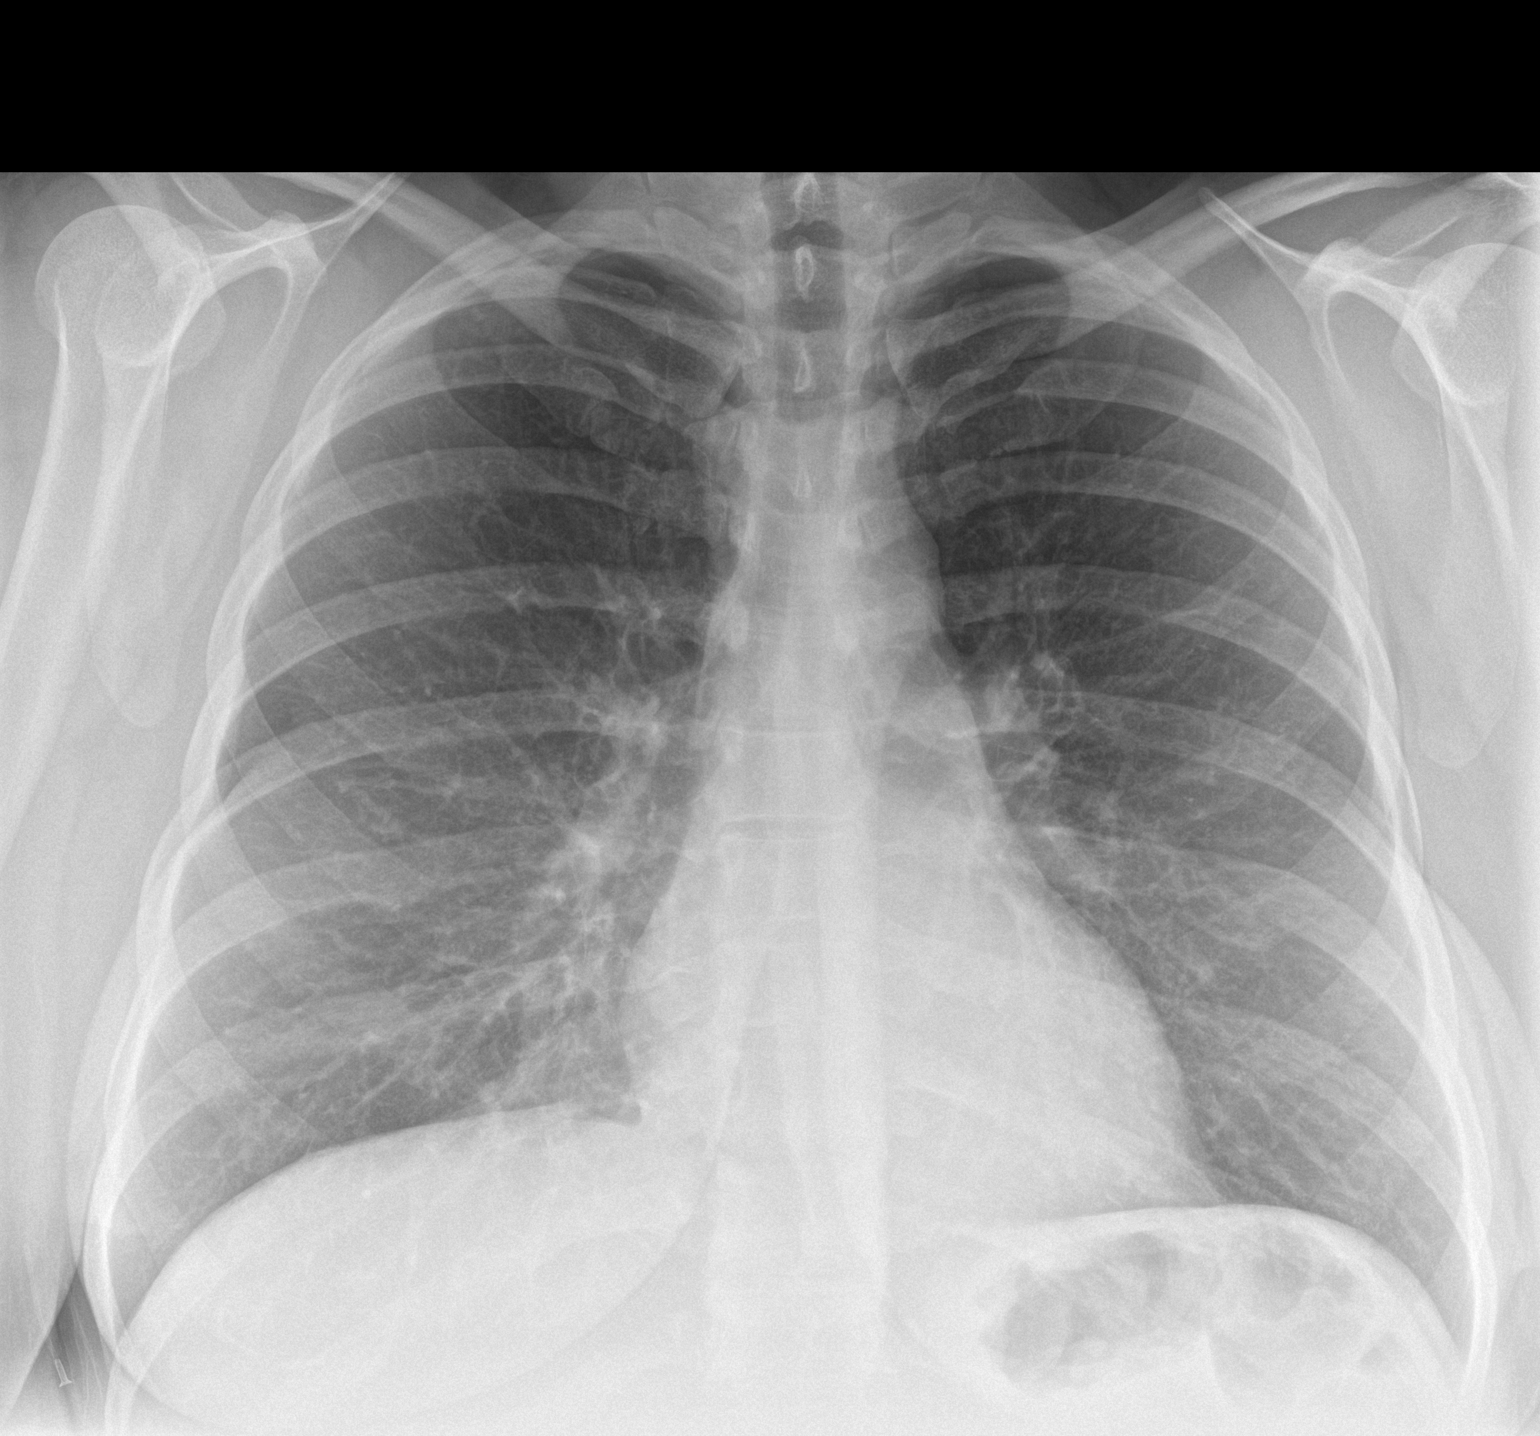
[im 2/2]
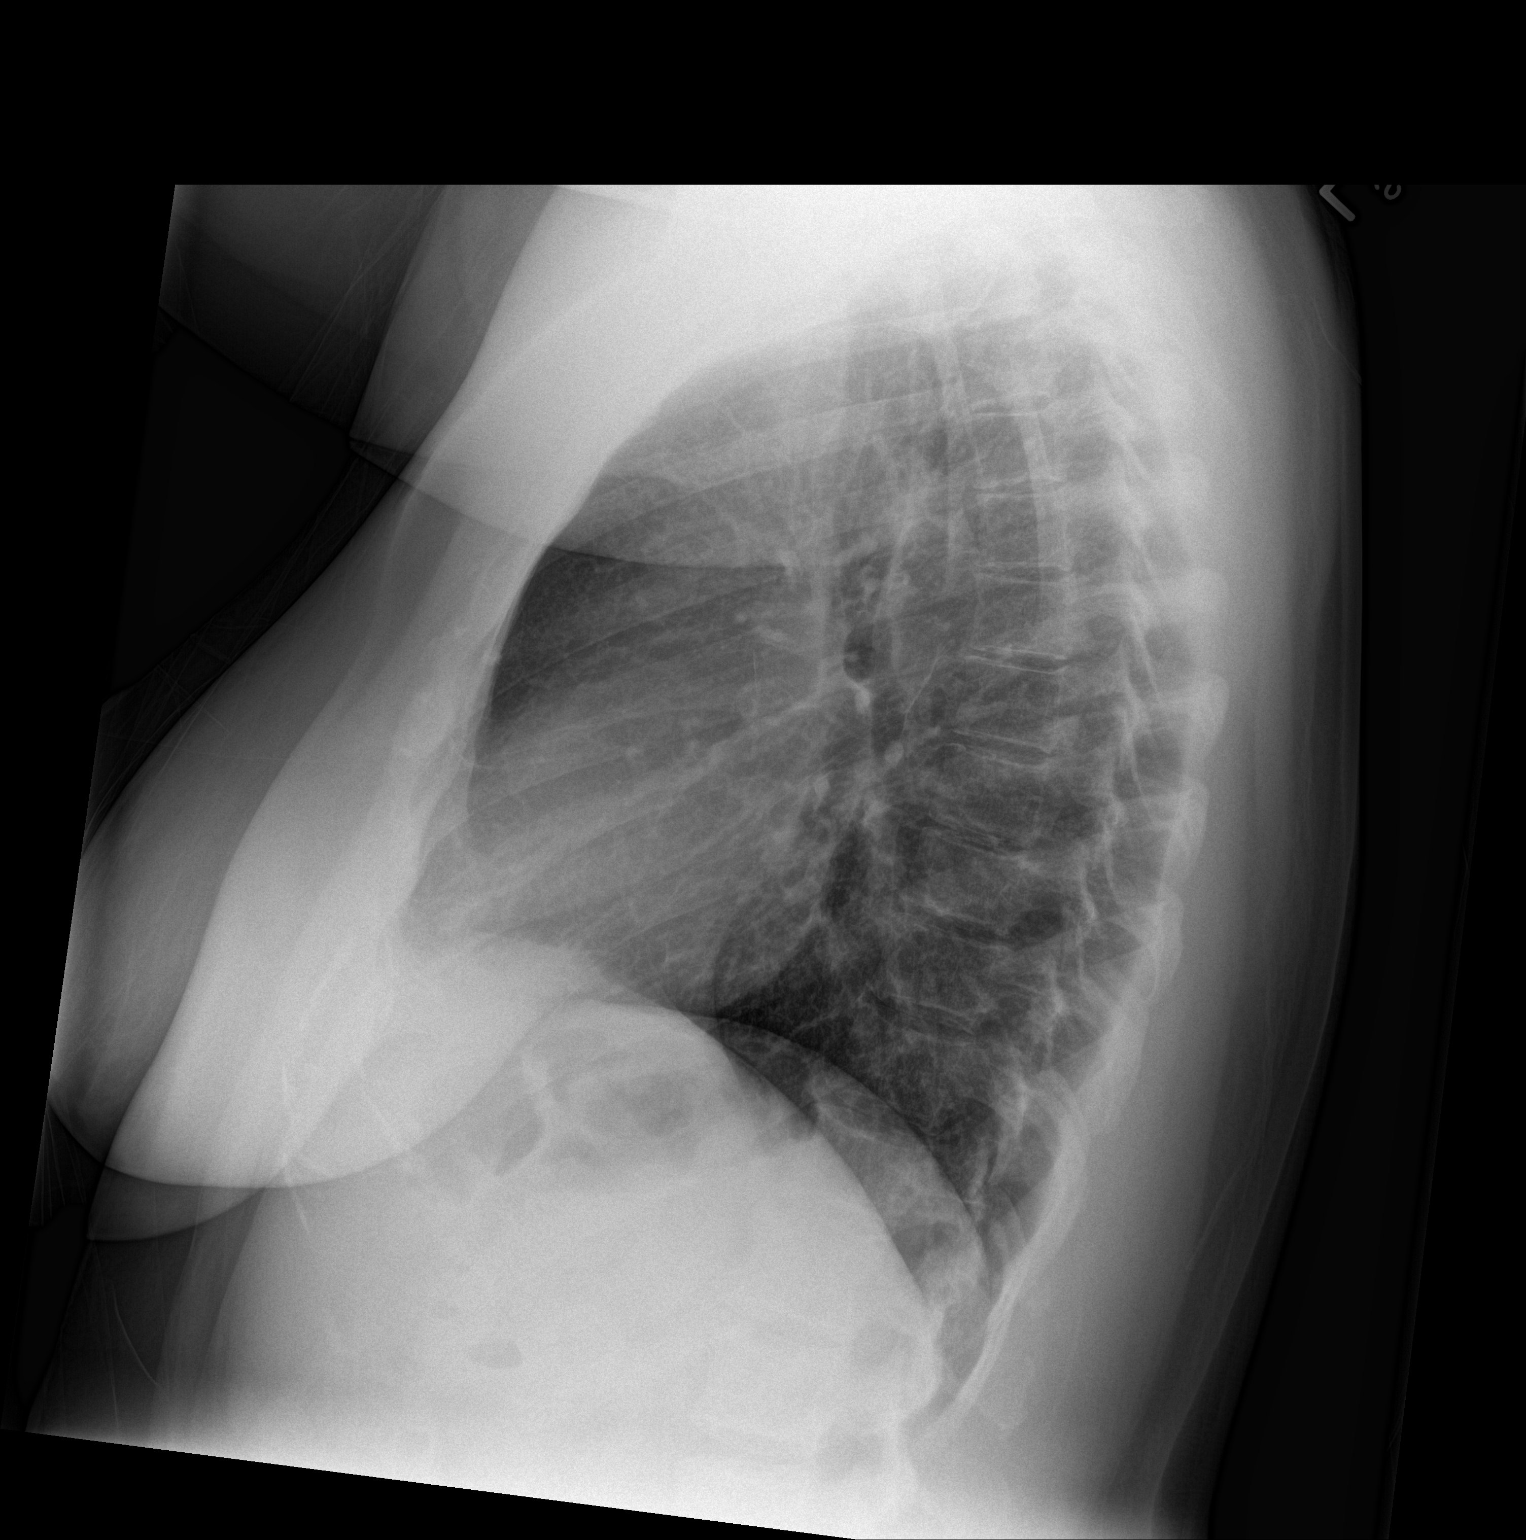

[2 of 2 positions shown; findings below may reference images not displayed]

FINDINGS: Lungs are clear. Heart size and pulmonary vascularity are normal. No
adenopathy. No pneumothorax. No bone lesions.
IMPRESSION: No edema or consolidation.

## 2016-11-15 ENCOUNTER — Encounter: Payer: Self-pay | Admitting: Emergency Medicine

## 2016-11-15 ENCOUNTER — Emergency Department
Admission: EM | Admit: 2016-11-15 | Discharge: 2016-11-15 | Disposition: A | Discharge status: Home / Self Care | Payer: Managed Care, Other (non HMO) | Attending: Emergency Medicine | Admitting: Emergency Medicine

## 2016-11-15 DIAGNOSIS — K0889 Other specified disorders of teeth and supporting structures: Secondary | ICD-10-CM | POA: Insufficient documentation

## 2016-11-15 DIAGNOSIS — F17210 Nicotine dependence, cigarettes, uncomplicated: Secondary | ICD-10-CM | POA: Insufficient documentation

## 2016-11-15 DIAGNOSIS — Z79899 Other long term (current) drug therapy: Secondary | ICD-10-CM | POA: Insufficient documentation

## 2016-11-15 MED ORDER — IBUPROFEN 600 MG PO TABS: 600.0000 mg | ORAL_TABLET | Freq: Three times a day (TID) | ORAL | 0 refills | Status: DC | PRN

## 2016-11-15 MED ORDER — PENICILLIN V POTASSIUM 500 MG PO TABS: 500.0000 mg | ORAL_TABLET | Freq: Four times a day (QID) | ORAL | 0 refills | Status: DC

## 2016-11-15 NOTE — ED Triage Notes (Signed)
Pt in via POV with complaints of intermittent dental pain; pt reports wisdom teeth cutting through gums in addition to having multiple broken teeth, being unable to take care of it via dentist due to financial reasons.  Pt states, "I just dont feel right since yesterday, I want to make sure they are not infected.

## 2016-11-15 NOTE — Discharge Instructions (Signed)
Call the  dental clinics listed on your discharge papers to see if you qualified to be seen at a reduced rate. Begin taking antibiotics as directed. Ibuprofen every 8 hours with food if needed for pain.  OPTIONS FOR DENTAL FOLLOW UP CARE  [facility] [place] [facility] and [place] [email or web address] [phone])  [facility] [phone])  [place] [phone])  [place] [phone])  [facility] [phone]) This clinic caters to the indigent population and is on a lottery system. Location: [employer] [facility], [place], [place], [place] [facility] Hours: Wednesdays from 6pm - 9pm, patients seen by a lottery system. For dates, call or go to [email or web address] Services: Cleanings, fillings and simple extractions. Payment Options: DENTAL WORK IS FREE OF CHARGE. Bring proof of income or support. Best way to get seen: Arrive at 5:15 pm - this is a lottery, NOT first come/first serve, so arriving earlier will not increase your chances of being seen.     [facility] [phone] Select option 1 for emergencies   Location: [facility], [place], [address] [place] Clinic Hours: No walk-ins accepted - call the day before to schedule an appointment. Check in times are 9:30 am and 1:30 pm. Services: Simple extractions, temporary fillings, pulpectomy/pulp debridement, uncomplicated abscess drainage. Payment Options: PAYMENT IS DUE AT THE TIME OF SERVICE.  Fee is usually $100-200, additional surgical procedures (e.g. abscess drainage) may be extra. Cash, checks, Visa/MasterCard accepted.  Can file Medicaid if patient is covered for dental - patient should call case worker to check. No discount for [facility]  patients. Best way to get seen: MUST call the day before and get onto the schedule. Can usually be seen the next 1-2 days. No walk-ins accepted.     [facility] [phone]   Location: [place], [place] [facility] Hours: M, W, Th, F 8am or 1:30pm, Tues 9a or 1:30 - first come/first served. Services: Simple extractions, temporary fillings, uncomplicated abscess drainage.  You do not need to be an [facility] resident. Payment Options: PAYMENT IS DUE AT THE TIME OF SERVICE. Dental insurance, otherwise sliding scale - bring proof of income or support. Depending on income and treatment needed, cost is usually $50-200. Best way to get seen: Arrive early as it is first come/first served.     [facility] [phone]   Location: [place] [facility] Hours: Mon-Thu 8a-5p Services: Most basic dental services including extractions and fillings. Payment Options: PAYMENT IS DUE AT THE TIME OF SERVICE. Sliding scale, up to 50% off - bring proof if income or support. Medicaid with dental option accepted. Best way to get seen: Call to schedule an appointment, can usually be seen within 2 weeks OR they will try to see walk-ins - show up at [place] or 2p (you may have to wait).     [facility] [place] RESIDENTS ONLY   Location: [facility], [place] [address], [place], [place] [address] Clinic Hours: By appointment only. Monday - Thursday 8am-5pm, Friday 8am-12pm Services: Cleanings, fillings, extractions. Payment Options: PAYMENT IS DUE AT THE TIME OF SERVICE. Cash, Visa or MasterCard. Sliding scale - $30 minimum per service. Best way to get seen: Come in to office, complete packet and make an appointment - need proof of  income or support monies for each household member and proof of [facility] residence. Usually takes about a month to  get in.     [facility] [phone]   Location: [address], [place] [facility] Hours: Walk-in Urgent Care Dental Services are offered Monday-Friday mornings only. The numbers of emergencies accepted daily is limited to the number of providers available. Maximum 15 - Mondays, Wednesdays & Thursdays Maximum 10 - Tuesdays & Fridays Services: You do not need to be a [facility] resident to be seen for a dental emergency. Emergencies are defined as pain, swelling, abnormal bleeding, or dental trauma. Walkins will receive x-rays if needed. NOTE: Dental cleaning is not an emergency. Payment Options: PAYMENT IS DUE AT THE TIME OF SERVICE. Minimum co-pay is $40.00 for uninsured patients. Minimum co-pay is $3.00 for Medicaid with dental coverage. Dental Insurance is accepted and must be presented at time of visit. Medicare does not cover dental. Forms of payment: Cash, credit card, checks. Best way to get seen: If not previously registered with the clinic, walk-in dental registration begins at 7:15 am and is on a first come/first serve basis. If previously registered with the clinic, call to make an appointment.     [facility] [place] ONLY   Location: [address], [place], [place] Clinic Hours: Mon-Thu 10a-2p Services: Extractions only! Payment Options: FREE (donations accepted) - bring proof of income or support Best way to get seen: Call and schedule an appointment OR come at 8am on the 1st Monday of every month (except for holidays) when it is first come/first served.     [facility] [phone]   Location: [place], [place] Clinic Hours: Friday mornings Services, Payment Options, Best way to get seen: Call for info

## 2016-11-15 NOTE — ED Provider Notes (Signed)
[facility] Emergency Department Provider Note  ____________________________________________   First MD Initiated Contact with Patient 11/15/16 1259     (approximate)  I have reviewed the triage vital signs and the nursing notes.   HISTORY  Chief Complaint Dental Pain   HPI Zoe Savage is a 31 y.o. female is in complaint of intermittent dental pain that is being one on for "sometime". Patient states that she has multiple broken teeth and has been unable to see a dentist due to financial reasons. Patient believes that her right lower tooth is now infected. She denies any known fever or chills. She is taking over-the-counter medication without any improvement.   Past Medical History:  Diagnosis Date  . Depression   . Fibroids    removed during CS  . PTSD (post-traumatic stress disorder)     Patient Active Problem List   Diagnosis Date Noted  . Postop check d&C 07/13/2015  . Missed abortion 07/09/2015  . History of cesarean delivery 06/17/2015    Past Surgical History:  Procedure Laterality Date  . CESAREAN SECTION    . DILATION AND CURETTAGE OF UTERUS N/A 07/10/2015   Procedure: SUCTION DILATATION AND CURETTAGE;  Surgeon: [name], MD;  Location: [facility];  Service: Gynecology;  Laterality: N/A;    Prior to Admission medications   Medication Sig Start Date End Date Taking? Authorizing Provider  acetaminophen (TYLENOL) 325 MG tablet Take 650 mg by mouth every 6 (six) hours as needed.    [provider]  ibuprofen (ADVIL,MOTRIN) 600 MG tablet Take 1 tablet (600 mg total) by mouth every 8 (eight) hours as needed. 11/15/16   Johnn Hai, PA-C  penicillin v potassium (VEETID) 500 MG tablet Take 1 tablet (500 mg total) by mouth 4 (four) times daily. 11/15/16   Johnn Hai, PA-C    Allergies Morphine and related  Family History  Problem Relation Age of Onset  . Arthritis Mother   . Arthritis Father   . Alcohol  abuse Father   . Depression Daughter   . Mental illness Maternal Grandmother   . Stroke Maternal Grandmother   . Osteoarthritis Paternal Grandmother   . Emphysema Paternal Grandfather     Social History Social History  Substance Use Topics  . Smoking status: Current Every Day Smoker    Packs/day: 0.50    Types: Cigarettes  . Smokeless tobacco: Never Used  . Alcohol use No    Review of Systems Constitutional: No fever/chills ENT: Positive dental pain. Cardiovascular: Denies chest pain. Respiratory: Denies shortness of breath. ____________________________________________   PHYSICAL EXAM:  VITAL SIGNS: ED Triage Vitals  Enc Vitals Group     BP 11/15/16 1240 128/75     Pulse Rate 11/15/16 1240 84     Resp 11/15/16 1240 18     Temp 11/15/16 1240 98 F (36.7 C)     Temp Source 11/15/16 1240 Oral     SpO2 11/15/16 1240 99 %     Weight 11/15/16 1241 210 lb (95.3 kg)     Height 11/15/16 1241 5\' 3"  (1.6 m)     Head Circumference --      Peak Flow --      Pain Score --      Pain Loc --      Pain Edu? --      Excl. in [place]? --     Constitutional: Alert and oriented. Well appearing and in no acute distress. Eyes: Conjunctivae are normal.  PERRL. EOMI. Head: Atraumatic. Nose: No congestion/rhinnorhea. Mouth/Throat: Mucous membranes are moist.  Oropharynx non-erythematous. Right lower molar with large cavity. The gum is not swollen. There is no obvious abscess or drainage noted. Neck: No stridor.   Hematological/Lymphatic/Immunilogical: No cervical lymphadenopathy. Cardiovascular: Normal rate, regular rhythm. Grossly normal heart sounds.  Good peripheral circulation. Respiratory: Normal respiratory effort.  No retractions. Lungs CTAB. Neurologic:  Normal speech and language.  No gait instability. Skin:  Skin is warm, dry and intact. No rash noted. Psychiatric: Mood and affect are normal. Speech and behavior are normal.  ____________________________________________    LABS (all labs ordered are listed, but only abnormal results are displayed)  Labs Reviewed - No data to display  PROCEDURES  Procedure(s) performed: None  Procedures  Critical Care performed: No  ____________________________________________   INITIAL IMPRESSION / ASSESSMENT AND PLAN / ED COURSE  Pertinent labs & imaging results that were available during my care of the patient were reviewed by me and considered in my medical decision making (see chart for details).  Patient was given a list of dental clinics in the area to contact to see if she qualifies for being seen. Patient is given a prescription for Pen-Vee K 500 mg 4 times a day for 7 days.    ___________________________________________   FINAL CLINICAL IMPRESSION(S) / ED DIAGNOSES  Final diagnoses:  Pain, dental      NEW MEDICATIONS STARTED DURING THIS VISIT:  Discharge Medication List as of 11/15/2016  1:21 PM    START taking these medications   Details  penicillin v potassium (VEETID) 500 MG tablet Take 1 tablet (500 mg total) by mouth 4 (four) times daily., Starting Tue 11/15/2016, Print         Note:  This document was prepared using Dragon voice recognition software and may include unintentional dictation errors.    Johnn Hai, PA-C 11/15/16 1422    Schuyler Amor, MD 11/15/16 [phone]

## 2017-01-09 ENCOUNTER — Encounter: Payer: Self-pay | Admitting: Emergency Medicine

## 2017-01-09 ENCOUNTER — Emergency Department: Payer: Managed Care, Other (non HMO)

## 2017-01-09 ENCOUNTER — Emergency Department
Admission: EM | Admit: 2017-01-09 | Discharge: 2017-01-09 | Disposition: A | Discharge status: Home / Self Care | Payer: Managed Care, Other (non HMO) | Attending: Emergency Medicine | Admitting: Emergency Medicine

## 2017-01-09 DIAGNOSIS — F17210 Nicotine dependence, cigarettes, uncomplicated: Secondary | ICD-10-CM | POA: Insufficient documentation

## 2017-01-09 DIAGNOSIS — M23300 Other meniscus derangements, unspecified lateral meniscus, right knee: Secondary | ICD-10-CM | POA: Insufficient documentation

## 2017-01-09 DIAGNOSIS — M25561 Pain in right knee: Secondary | ICD-10-CM | POA: Diagnosis present

## 2017-01-09 MED ORDER — HYDROCODONE-ACETAMINOPHEN 5-325 MG PO TABS: 1.0000 | ORAL_TABLET | ORAL | 0 refills | Status: DC | PRN

## 2017-01-09 MED ORDER — MELOXICAM 15 MG PO TABS: 15.0000 mg | ORAL_TABLET | Freq: Every day | ORAL | 1 refills | Status: DC

## 2017-01-09 NOTE — ED Notes (Signed)
See triage note  Presents with right knee pain and swelling  States sx's started several months ago but became worse

## 2017-01-09 NOTE — ED Provider Notes (Signed)
[facility] Emergency Department Provider Note  ____________________________________________  Time seen: Approximately 6:03 PM  I have reviewed the triage vital signs and the nursing notes.   HISTORY  Chief Complaint Knee Pain    HPI Zoe Savage is a 31 y.o. female presents to emergency room complaining of right lateral knee pain. Patient reports that she had similar symptoms several months ago, was evaluated and after treatment had improvement of her symptoms. Patient reports that over the past month she has felt an increased grinding, as well as a clicking and catching sensation to her right knee. Pain and swelling is always along the lateral joint line. No recent injury. Patient uses over-the-counter medication with minimal relief. No other complaints at this time.   Past Medical History:  Diagnosis Date  . Depression   . Fibroids    removed during CS  . PTSD (post-traumatic stress disorder)     Patient Active Problem List   Diagnosis Date Noted  . Postop check d&C 07/13/2015  . Missed abortion 07/09/2015  . History of cesarean delivery 06/17/2015    Past Surgical History:  Procedure Laterality Date  . CESAREAN SECTION    . DILATION AND CURETTAGE OF UTERUS N/A 07/10/2015   Procedure: SUCTION DILATATION AND CURETTAGE;  Surgeon: [name], MD;  Location: [facility];  Service: Gynecology;  Laterality: N/A;    Prior to Admission medications   Medication Sig Start Date End Date Taking? Authorizing Provider  acetaminophen (TYLENOL) 325 MG tablet Take 650 mg by mouth every 6 (six) hours as needed.    [provider]  HYDROcodone-acetaminophen (NORCO/VICODIN) 5-325 MG tablet Take 1 tablet by mouth every 4 (four) hours as needed for moderate pain. 01/09/17   Edynn Gillock, Charline Bills, PA-C  ibuprofen (ADVIL,MOTRIN) 600 MG tablet Take 1 tablet (600 mg total) by mouth every 8 (eight) hours as needed. 11/15/16   Johnn Hai, PA-C   meloxicam (MOBIC) 15 MG tablet Take 1 tablet (15 mg total) by mouth daily. 01/09/17   Avram Danielson, Charline Bills, PA-C  penicillin v potassium (VEETID) 500 MG tablet Take 1 tablet (500 mg total) by mouth 4 (four) times daily. 11/15/16   Johnn Hai, PA-C    Allergies Morphine and related  Family History  Problem Relation Age of Onset  . Arthritis Mother   . Arthritis Father   . Alcohol abuse Father   . Depression Daughter   . Mental illness Maternal Grandmother   . Stroke Maternal Grandmother   . Osteoarthritis Paternal Grandmother   . Emphysema Paternal Grandfather     Social History Social History  Substance Use Topics  . Smoking status: Current Every Day Smoker    Packs/day: 0.50    Types: Cigarettes  . Smokeless tobacco: Never Used  . Alcohol use No     Review of Systems  Constitutional: No fever/chills Cardiovascular: no chest pain. Respiratory: no cough. No SOB. Musculoskeletal: positive for right lateral knee pain Skin: Negative for rash, abrasions, lacerations, ecchymosis. Neurological: Negative for headaches, focal weakness or numbness. 10-point ROS otherwise negative.  ____________________________________________   PHYSICAL EXAM:  VITAL SIGNS: ED Triage Vitals  Enc Vitals Group     BP 01/09/17 1642 (!) 98/55     Pulse Rate 01/09/17 1642 84     Resp 01/09/17 1642 18     Temp 01/09/17 1642 99.5 F (37.5 C)     Temp Source 01/09/17 1642 Oral     SpO2 01/09/17 1642 97 %  Weight 01/09/17 1643 210 lb (95.3 kg)     Height 01/09/17 1643 5\' 3"  (1.6 m)     Head Circumference --      Peak Flow --      Pain Score 01/09/17 1642 5     Pain Loc --      Pain Edu? --      Excl. in [place]? --      Constitutional: Alert and oriented. Well appearing and in no acute distress. Eyes: Conjunctivae are normal. PERRL. EOMI. Head: Atraumatic. Neck: No stridor.    Cardiovascular: Normal rate, regular rhythm. Normal S1 and S2.  Good peripheral  circulation. Respiratory: Normal respiratory effort without tachypnea or retractions. Lungs CTAB. Good air entry to the bases with no decreased or absent breath sounds. Musculoskeletal: Full range of motion to all extremities. No gross deformities appreciated.no gross deformity or edema noted to the right knee. Limited range of motion due to pain. Patient is very tender to palpation along the lateral joint line. No palpable abnormality. Lachman's,varus and valgus is negative. McMurray's is positive for medial meniscal derangement.dorsalis pedis pulse intact distally. Sensation intact distally. Neurologic:  Normal speech and language. No gross focal neurologic deficits are appreciated.  Skin:  Skin is warm, dry and intact. No rash noted. Psychiatric: Mood and affect are normal. Speech and behavior are normal. Patient exhibits appropriate insight and judgement.   ____________________________________________   LABS (all labs ordered are listed, but only abnormal results are displayed)  Labs Reviewed - No data to display ____________________________________________  EKG   ____________________________________________  RADIOLOGY Diamantina [facility] Arek Sp[place]fore, personally viewed and evaluated these images (plain radiographs) as part of my medical decision making, as well as reviewing the written report by the radiologist.  Dg Knee Complete 4 Views Right  Result Date: 01/09/2017 CLINICAL DATA:  Knee pain and swelling EXAM: RIGHT KNEE - COMPLETE 4+ VIEW COMPARISON:  08/04/2016 FINDINGS: No evidence of fracture, dislocation, or joint effusion. No evidence of arthropathy or other focal bone abnormality. Soft tissues are unremarkable. IMPRESSION: Negative. Electronically Signed   By: Donavan Foil M.D.   On: 01/09/2017 17:47    ____________________________________________    PROCEDURES  Procedure(s) performed:    Procedures    Medications - No data to  display   ____________________________________________   INITIAL IMPRESSION / ASSESSMENT AND PLAN / ED COURSE  Pertinent labs & imaging results that were available during my care of the patient were reviewed by me and considered in my medical decision making (see chart for details).  Review of the [place] CSRS was performed in accordance of the [place] prior to dispensing any controlled drugs.     Patient's diagnosis is consistent with lateral meniscal derangement to the right knee. She presents with symptoms consistent with meniscal derangement. X-ray reveals no acute osseous abnormality. Exam is otherwise reassuring. No indication for further imaging or labs.. Patient will be discharged home with prescriptions for limited pain medication and anti-inflammatories. Patient is to follow up with orthopedics for further management. Patient has a knee brace at home a is instructed to use same. Patient is given ED precautions to return to the ED for any worsening or new symptoms.     ____________________________________________  FINAL CLINICAL IMPRESSION(S) / ED DIAGNOSES  Final diagnoses:  Meniscus, lateral, derangement, right      NEW MEDICATIONS STARTED DURING THIS VISIT:  New Prescriptions   HYDROCODONE-ACETAMINOPHEN (NORCO/VICODIN) 5-325 MG TABLET    Take 1 tablet by mouth every 4 (four) hours as  needed for moderate pain.   MELOXICAM (MOBIC) 15 MG TABLET    Take 1 tablet (15 mg total) by mouth daily.        This chart was dictated using voice recognition software/Dragon. Despite best efforts to proofread, errors can occur which can change the meaning. Any change was purely unintentional.    Darletta Moll, PA-C 01/09/17 1811    Darel Hong, MD 01/09/17 [phone]

## 2017-01-09 NOTE — ED Triage Notes (Signed)
States R knee swelling and pain noted this am. Has been having trouble with that knee for few months. Denies fall or injury at onset.

## 2017-05-03 IMAGING — US US OB COMP LESS 14 WK
1 series · 13 of 28 positions shown · non-contrast
Comparison: Prior pelvic ultrasound 03/02/2015

CLINICAL DATA: 29-year-old female reportedly 9 weeks 4 days
pregnant with a quantitative beta HCG of [DATE]. One day history of
cramping and vaginal bleeding.

EXAM:
OBSTETRIC <14 WK US AND TRANSVAGINAL OB US
TECHNIQUE: Both transabdominal and transvaginal ultrasound examinations were
performed for complete evaluation of the gestation as well as the
maternal uterus, adnexal regions, and pelvic cul-de-sac.
Transvaginal technique was performed to assess early pregnancy.

[Series 1: us ob comp less 14 wk · 0.22mm/px · 13 of 123 slices shown]
[im 5/123]
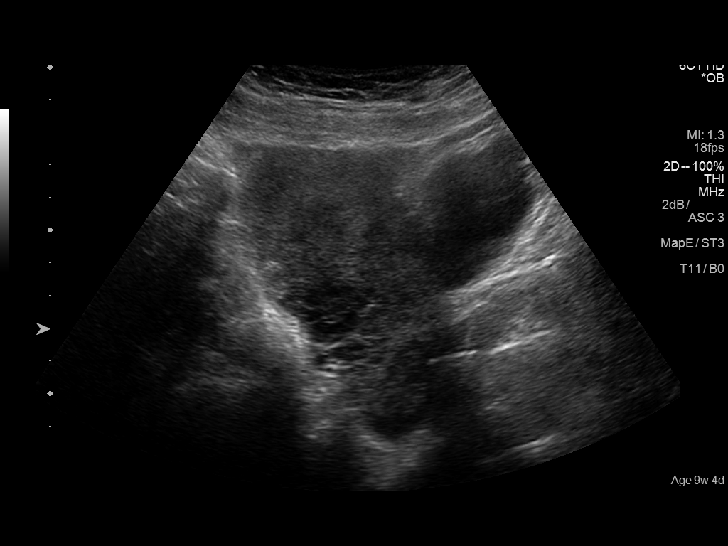
[im 14/123]
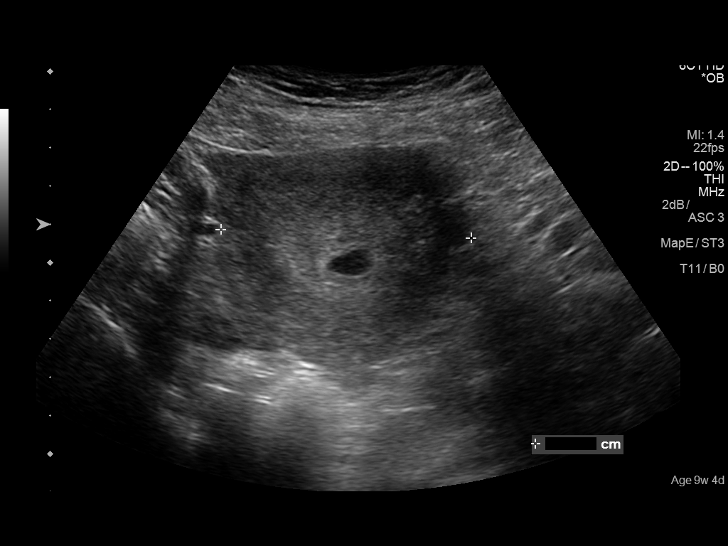
[im 23/123]
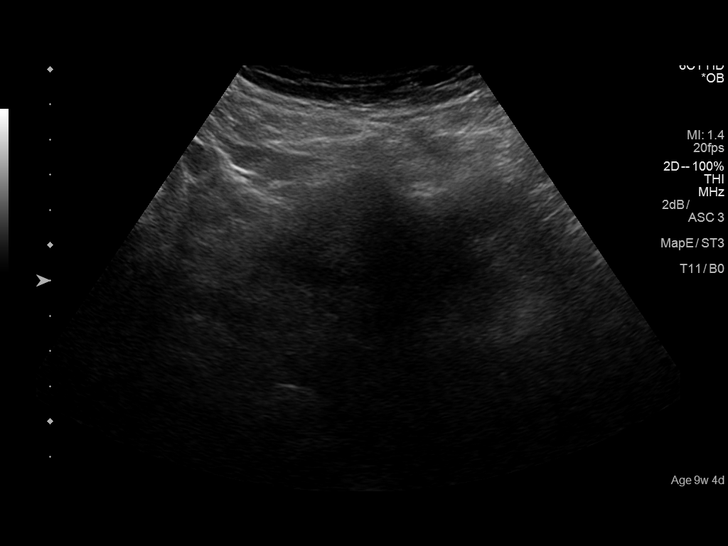
[im 32/123]
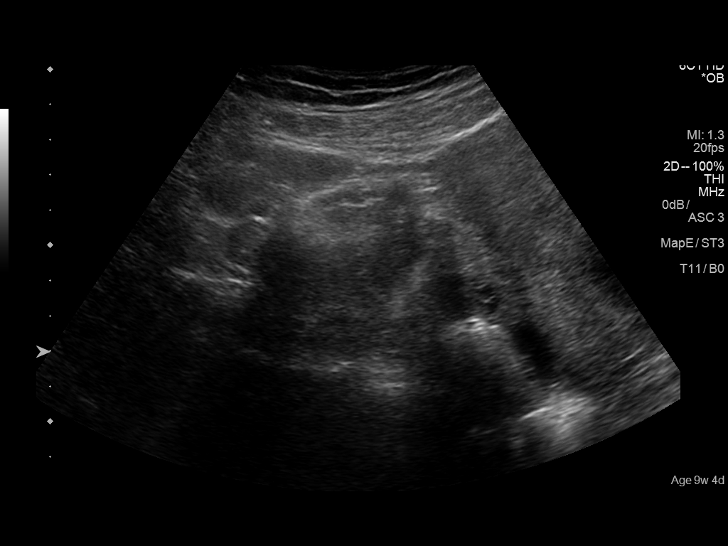
[im 41/123]
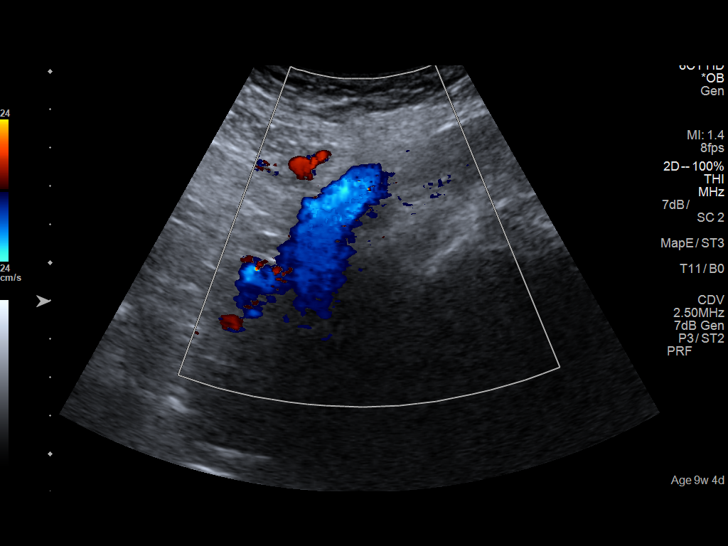
[im 50/123]
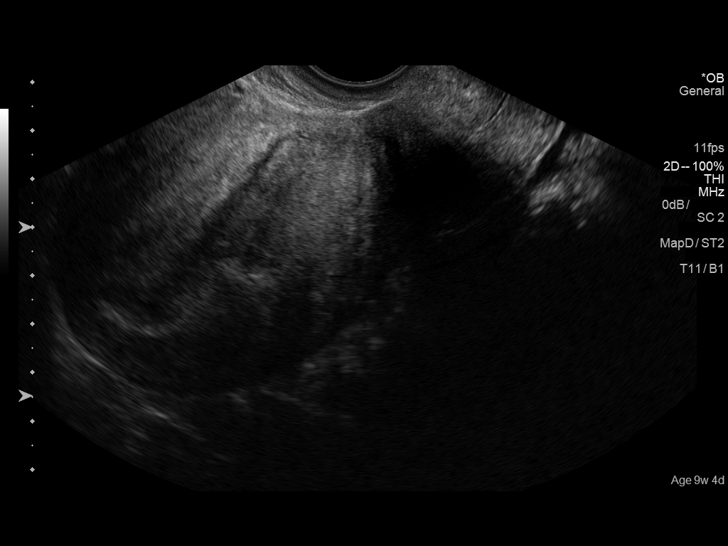
[im 64/123]
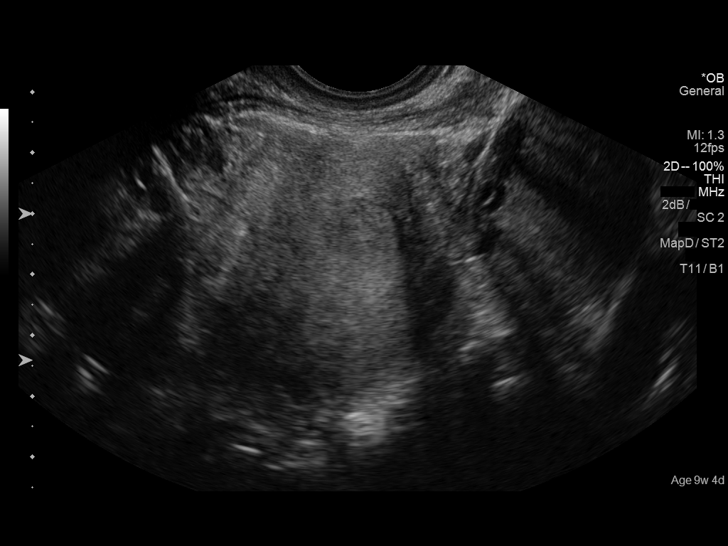
[im 73/123]
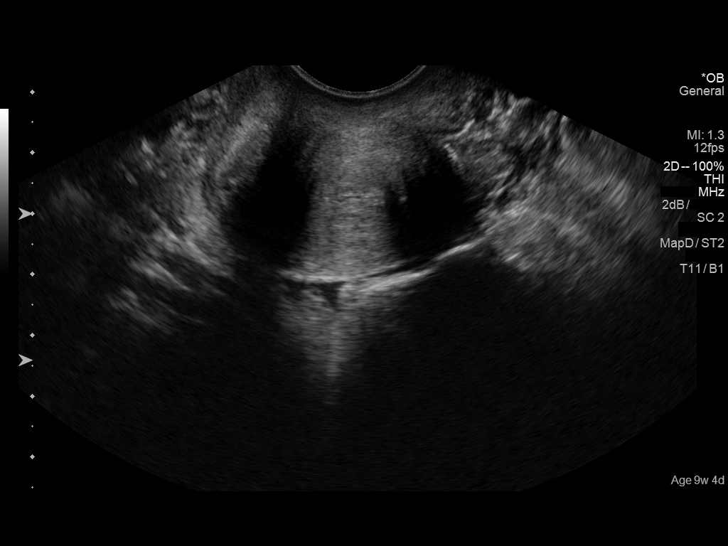
[im 82/123]
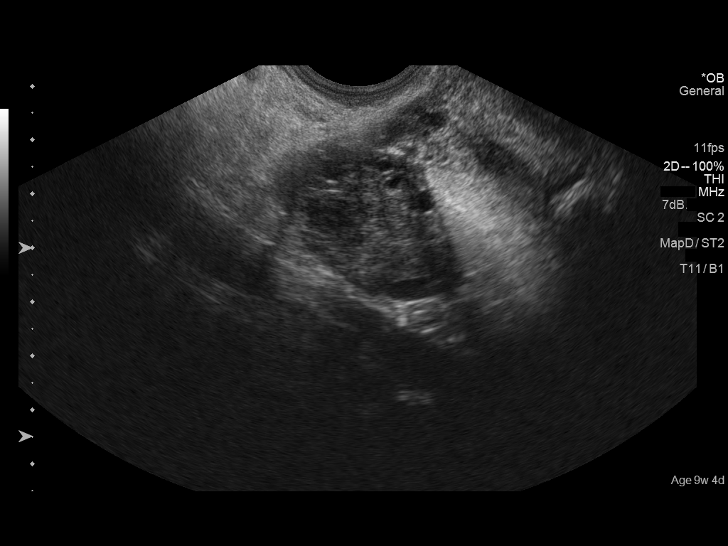
[im 91/123]
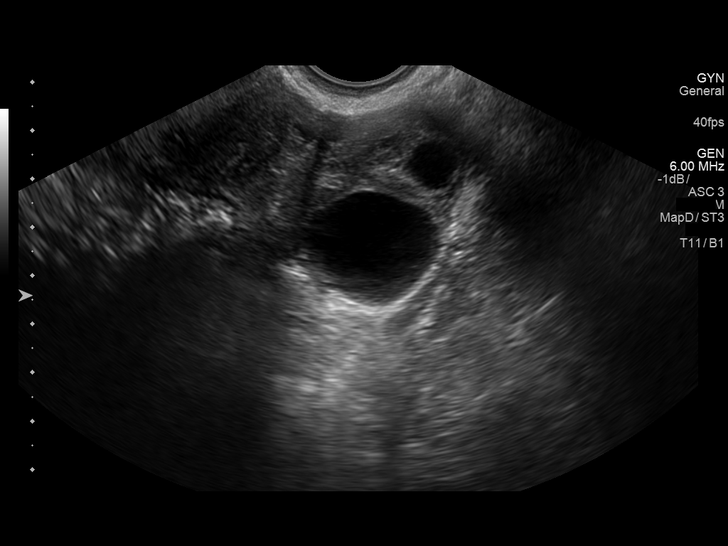
[im 100/123]
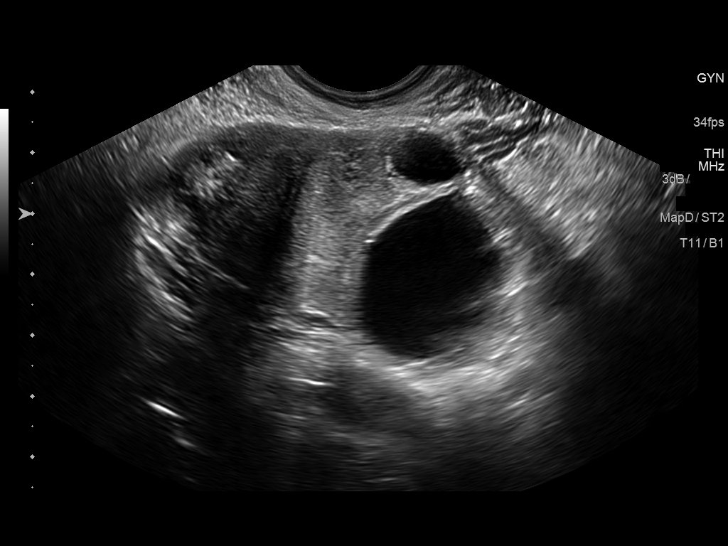
[im 109/123]
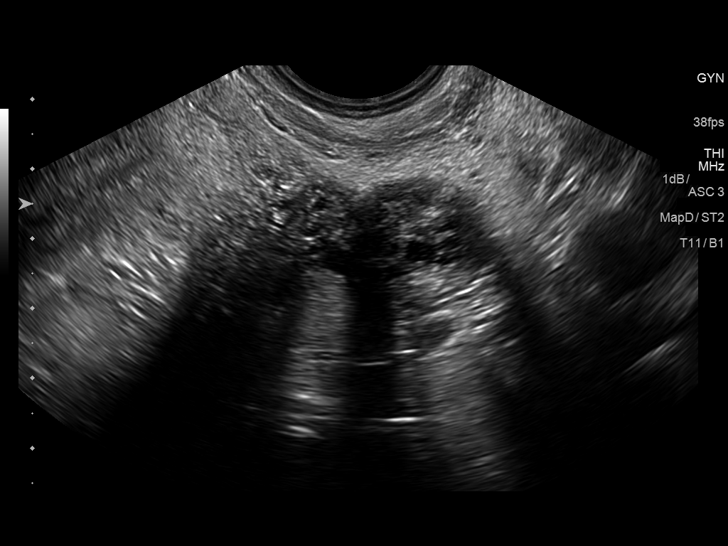
[im 118/123]
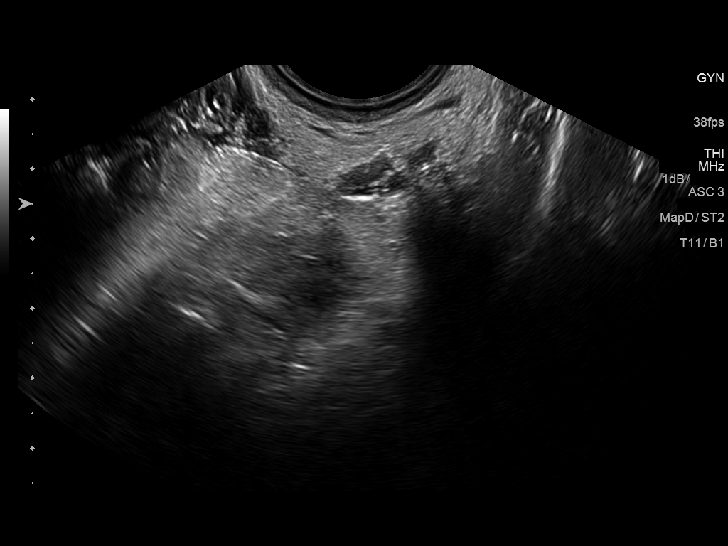

[13 of 28 positions shown; findings below may reference images not displayed]

FINDINGS: Intrauterine gestational sac: Single intrauterine gestational sac.

Yolk sac:  Not visualized

Embryo:  Not visualized

MSD: 11  mm   5 w   6  d

Subchorionic hemorrhage:  None visualized.

Maternal uterus/adnexae: Within normal limits.
IMPRESSION: Probable early intrauterine gestational sac, but no yolk sac, fetal
pole, or cardiac activity yet visualized. Recommend follow-up
quantitative B-HCG levels and follow-up US in 14 days to confirm and
assess viability. This recommendation follows SRU consensus
guidelines: Diagnostic Criteria for Nonviable Pregnancy Early in the
First Trimester. N Engl J Med 7500; [DATE].

By mean sac diameter, the estimated gestational age is 5 weeks 6
days.

## 2018-02-06 ENCOUNTER — Encounter: Payer: Self-pay | Admitting: Emergency Medicine

## 2018-02-06 ENCOUNTER — Other Ambulatory Visit: Payer: Self-pay

## 2018-02-06 ENCOUNTER — Emergency Department
Admission: EM | Admit: 2018-02-06 | Discharge: 2018-02-06 | Disposition: A | Discharge status: Home / Self Care | Payer: Self-pay | Attending: Emergency Medicine | Admitting: Emergency Medicine

## 2018-02-06 ENCOUNTER — Emergency Department: Payer: Self-pay

## 2018-02-06 DIAGNOSIS — R079 Chest pain, unspecified: Secondary | ICD-10-CM | POA: Insufficient documentation

## 2018-02-06 DIAGNOSIS — Z5321 Procedure and treatment not carried out due to patient leaving prior to being seen by health care provider: Secondary | ICD-10-CM | POA: Insufficient documentation

## 2018-02-06 DIAGNOSIS — R109 Unspecified abdominal pain: Secondary | ICD-10-CM | POA: Insufficient documentation

## 2018-02-06 LAB — COMPREHENSIVE METABOLIC PANEL
ALBUMIN: 4.2 g/dL (ref 3.5–5.0)
ALK PHOS: 60 U/L (ref 38–126)
ALT: 15 U/L (ref 0–44)
AST: 23 U/L (ref 15–41)
Anion gap: 7 (ref 5–15)
BUN: 9 mg/dL (ref 6–20)
CO2: 24 mmol/L (ref 22–32)
Calcium: 9 mg/dL (ref 8.9–10.3)
Chloride: 106 mmol/L (ref 98–111)
Creatinine, Ser: 0.69 mg/dL (ref 0.44–1.00)
GFR calc Af Amer: >60 mL/min (ref >60)
GFR calc non Af Amer: >60 mL/min (ref >60)
GLUCOSE: 109 mg/dL — AB (ref 70–99)
POTASSIUM: 3.8 mmol/L (ref 3.5–5.1)
Sodium: 137 mmol/L (ref 135–145)
Total Bilirubin: 0.4 mg/dL (ref 0.3–1.2)
Total Protein: 7.2 g/dL (ref 6.5–8.1)

## 2018-02-06 LAB — CBC WITH DIFFERENTIAL/PLATELET
ABS IMMATURE GRANULOCYTES: 0.08 10*3/uL — AB (ref 0.00–0.07)
BASOS ABS: 0.1 10*3/uL (ref 0.0–0.1)
Basophils Relative: 1 %
Eosinophils Absolute: 0.5 10*3/uL (ref 0.0–0.5)
Eosinophils Relative: 3 %
HCT: 45.3 % (ref 36.0–46.0)
HEMOGLOBIN: 15.4 g/dL — AB (ref 12.0–15.0)
IMMATURE GRANULOCYTES: 1 %
Lymphocytes Relative: 30 %
Lymphs Abs: 4.3 10*3/uL — ABNORMAL HIGH (ref 0.7–4.0)
MCH: 31.5 pg (ref 26.0–34.0)
MCHC: 34 g/dL (ref 30.0–36.0)
MCV: 92.6 fL (ref 80.0–100.0)
MONO ABS: 0.9 10*3/uL (ref 0.1–1.0)
MONOS PCT: 7 %
Neutro Abs: 8.2 10*3/uL — ABNORMAL HIGH (ref 1.7–7.7)
Neutrophils Relative %: 58 %
Platelets: 334 10*3/uL (ref 150–400)
RBC: 4.89 MIL/uL (ref 3.87–5.11)
RDW: 12.3 % (ref 11.5–15.5)
WBC: 14 10*3/uL — AB (ref 4.0–10.5)
nRBC: 0 % (ref 0.0–0.2)

## 2018-02-06 LAB — URINALYSIS, COMPLETE (UACMP) WITH MICROSCOPIC
BILIRUBIN URINE: NEGATIVE
Glucose, UA: NEGATIVE mg/dL
KETONES UR: NEGATIVE mg/dL
Leukocytes, UA: NEGATIVE
NITRITE: NEGATIVE
PROTEIN: NEGATIVE mg/dL
SPECIFIC GRAVITY, URINE: 1.021 (ref 1.005–1.030)
pH: 6 (ref 5.0–8.0)

## 2018-02-06 LAB — LIPASE, BLOOD: Lipase: 57 U/L — ABNORMAL HIGH (ref 11–51)

## 2018-02-06 LAB — POCT PREGNANCY, URINE: PREG TEST UR: NEGATIVE

## 2018-02-06 LAB — TROPONIN I: Troponin I: <0.03 ng/mL (ref <0.03)

## 2018-02-06 NOTE — ED Triage Notes (Addendum)
Patient ambulatory to triage with steady gait, without difficulty or distress noted; pt reports N/V since yesterday with mid upper abd pain with no hx of same; pt also reports left sided CP radiating into back

## 2018-02-07 ENCOUNTER — Telehealth: Payer: Self-pay | Admitting: Emergency Medicine

## 2018-02-07 NOTE — Telephone Encounter (Signed)
Called patient due to lwot to inquire about condition and follow up plans.  Unable to complete call.

## 2020-04-27 ENCOUNTER — Encounter (HOSPITAL_COMMUNITY): Payer: Self-pay

## 2020-04-27 ENCOUNTER — Emergency Department (HOSPITAL_COMMUNITY): Payer: Medicaid Other

## 2020-04-27 ENCOUNTER — Other Ambulatory Visit: Payer: Self-pay

## 2020-04-27 ENCOUNTER — Emergency Department (HOSPITAL_COMMUNITY)
Admission: EM | Admit: 2020-04-27 | Discharge: 2020-04-27 | Disposition: A | Discharge status: Home / Self Care | Payer: Medicaid Other | Attending: Emergency Medicine | Admitting: Emergency Medicine

## 2020-04-27 DIAGNOSIS — M79641 Pain in right hand: Secondary | ICD-10-CM | POA: Diagnosis present

## 2020-04-27 DIAGNOSIS — Z5321 Procedure and treatment not carried out due to patient leaving prior to being seen by health care provider: Secondary | ICD-10-CM | POA: Insufficient documentation

## 2020-04-27 NOTE — ED Triage Notes (Signed)
Pt reports right hand pain radiating up into her elbow starting approx 2 hours after donating plasma yesterday

## 2020-04-27 NOTE — ED Notes (Signed)
Pt not answering for vital recheck

## 2020-08-06 ENCOUNTER — Encounter (HOSPITAL_BASED_OUTPATIENT_CLINIC_OR_DEPARTMENT_OTHER): Payer: Self-pay

## 2020-08-06 ENCOUNTER — Other Ambulatory Visit: Payer: Self-pay

## 2020-08-06 ENCOUNTER — Emergency Department (HOSPITAL_BASED_OUTPATIENT_CLINIC_OR_DEPARTMENT_OTHER)
Admission: EM | Admit: 2020-08-06 | Discharge: 2020-08-07 | Disposition: A | Discharge status: Home / Self Care | Payer: 59 | Attending: Emergency Medicine | Admitting: Emergency Medicine

## 2020-08-06 DIAGNOSIS — F17210 Nicotine dependence, cigarettes, uncomplicated: Secondary | ICD-10-CM | POA: Diagnosis not present

## 2020-08-06 DIAGNOSIS — R1110 Vomiting, unspecified: Secondary | ICD-10-CM | POA: Diagnosis not present

## 2020-08-06 DIAGNOSIS — R1013 Epigastric pain: Secondary | ICD-10-CM | POA: Diagnosis present

## 2020-08-06 DIAGNOSIS — N3000 Acute cystitis without hematuria: Secondary | ICD-10-CM | POA: Diagnosis not present

## 2020-08-06 DIAGNOSIS — R1011 Right upper quadrant pain: Secondary | ICD-10-CM

## 2020-08-06 LAB — COMPREHENSIVE METABOLIC PANEL
ALT: 16 U/L (ref 0–44)
AST: 21 U/L (ref 15–41)
Albumin: 3.9 g/dL (ref 3.5–5.0)
Alkaline Phosphatase: 61 U/L (ref 38–126)
Anion gap: 11 (ref 5–15)
BUN: 11 mg/dL (ref 6–20)
CO2: 22 mmol/L (ref 22–32)
Calcium: 9 mg/dL (ref 8.9–10.3)
Chloride: 103 mmol/L (ref 98–111)
Creatinine, Ser: 0.7 mg/dL (ref 0.44–1.00)
GFR, Estimated: >60 mL/min (ref >60)
Glucose, Bld: 106 mg/dL — ABNORMAL HIGH (ref 70–99)
Potassium: 3.9 mmol/L (ref 3.5–5.1)
Sodium: 136 mmol/L (ref 135–145)
Total Bilirubin: 0.3 mg/dL (ref 0.3–1.2)
Total Protein: 7 g/dL (ref 6.5–8.1)

## 2020-08-06 LAB — CBC
HCT: 41.4 % (ref 36.0–46.0)
Hemoglobin: 14 g/dL (ref 12.0–15.0)
MCH: 30.7 pg (ref 26.0–34.0)
MCHC: 33.8 g/dL (ref 30.0–36.0)
MCV: 90.8 fL (ref 80.0–100.0)
Platelets: 360 10*3/uL (ref 150–400)
RBC: 4.56 MIL/uL (ref 3.87–5.11)
RDW: 12.4 % (ref 11.5–15.5)
WBC: 12.7 10*3/uL — ABNORMAL HIGH (ref 4.0–10.5)
nRBC: 0 % (ref 0.0–0.2)

## 2020-08-06 LAB — LIPASE, BLOOD: Lipase: 47 U/L (ref 11–51)

## 2020-08-06 MED ORDER — ONDANSETRON HCL 4 MG/2ML IJ SOLN
4.0000 mg | Freq: Once | INTRAMUSCULAR | Status: AC | PRN
  Administered 2020-08-06: 4 mg via INTRAVENOUS
  Filled 2020-08-06: qty 2

## 2020-08-06 MED ORDER — FENTANYL CITRATE (PF) 100 MCG/2ML IJ SOLN
50.0000 ug | Freq: Once | INTRAMUSCULAR | Status: AC
  Administered 2020-08-06: 50 ug via INTRAVENOUS
  Filled 2020-08-06: qty 2

## 2020-08-06 MED ORDER — ALUM & MAG HYDROXIDE-SIMETH 200-200-20 MG/5ML PO SUSP
30.0000 mL | Freq: Once | ORAL | Status: AC
  Administered 2020-08-06: 30 mL via ORAL
  Filled 2020-08-06: qty 30

## 2020-08-06 MED ORDER — PANTOPRAZOLE SODIUM 40 MG PO TBEC
40.0000 mg | DELAYED_RELEASE_TABLET | Freq: Once | ORAL | Status: AC
  Administered 2020-08-06: 40 mg via ORAL
  Filled 2020-08-06: qty 1

## 2020-08-06 MED ORDER — LIDOCAINE VISCOUS HCL 2 % MT SOLN
15.0000 mL | Freq: Once | OROMUCOSAL | Status: AC
  Administered 2020-08-06: 15 mL via OROMUCOSAL
  Filled 2020-08-06: qty 15

## 2020-08-06 MED ORDER — METOCLOPRAMIDE HCL 5 MG/ML IJ SOLN
10.0000 mg | Freq: Once | INTRAMUSCULAR | Status: AC
  Administered 2020-08-06: 10 mg via INTRAVENOUS
  Filled 2020-08-06: qty 2

## 2020-08-06 NOTE — ED Triage Notes (Signed)
Pt c/o abd pain x 3 days-NAD-to triage in w/c

## 2020-08-07 ENCOUNTER — Emergency Department (HOSPITAL_BASED_OUTPATIENT_CLINIC_OR_DEPARTMENT_OTHER): Payer: 59

## 2020-08-07 DIAGNOSIS — N3000 Acute cystitis without hematuria: Secondary | ICD-10-CM | POA: Diagnosis not present

## 2020-08-07 LAB — PREGNANCY, URINE: Preg Test, Ur: NEGATIVE

## 2020-08-07 LAB — URINALYSIS, ROUTINE W REFLEX MICROSCOPIC
Bilirubin Urine: NEGATIVE
Glucose, UA: NEGATIVE mg/dL
Ketones, ur: NEGATIVE mg/dL
Leukocytes,Ua: NEGATIVE
Nitrite: NEGATIVE
Protein, ur: NEGATIVE mg/dL
Specific Gravity, Urine: >1.03 — ABNORMAL HIGH (ref 1.005–1.030)
pH: 5.5 (ref 5.0–8.0)

## 2020-08-07 LAB — URINALYSIS, MICROSCOPIC (REFLEX)

## 2020-08-07 MED ORDER — ONDANSETRON 4 MG PO TBDP: ORAL_TABLET | ORAL | 0 refills | Status: DC

## 2020-08-07 MED ORDER — ALUM & MAG HYDROXIDE-SIMETH 400-400-40 MG/5ML PO SUSP: 15.0000 mL | Freq: Four times a day (QID) | ORAL | 0 refills | Status: DC | PRN

## 2020-08-07 MED ORDER — CEPHALEXIN 500 MG PO CAPS: 500.0000 mg | ORAL_CAPSULE | Freq: Four times a day (QID) | ORAL | 0 refills | Status: DC

## 2020-08-07 MED ORDER — CEPHALEXIN 250 MG PO CAPS
1000.0000 mg | ORAL_CAPSULE | Freq: Once | ORAL | Status: AC
  Administered 2020-08-07: 1000 mg via ORAL
  Filled 2020-08-07: qty 4

## 2020-08-07 MED ORDER — PANTOPRAZOLE SODIUM 20 MG PO TBEC: 20.0000 mg | DELAYED_RELEASE_TABLET | Freq: Every day | ORAL | 0 refills | Status: DC

## 2020-08-07 MED ORDER — IOHEXOL 300 MG/ML  SOLN
100.0000 mL | Freq: Once | INTRAMUSCULAR | Status: AC | PRN
  Administered 2020-08-07: 100 mL via INTRAVENOUS

## 2020-08-07 NOTE — Discharge Instructions (Addendum)
Your CT scan showed a little bit of swelling around your appendix.  This is not where your pain is located and there is no other evidence of appendicitis on the CT scan.  If symptoms seem to get worse, not get better or migrate to your right lower abdomen then please return for reevaluation.

## 2020-08-07 NOTE — ED Provider Notes (Signed)
[facility] Provider Note   CSN: [id number] Arrival date & time: 08/06/20  2155     History Chief Complaint  Patient presents with  . Abdominal Pain    Zoe Savage is a 35 y.o. female.  Patient with a burning type pain in her epigastric area that radiates up to her throat.  She developed a couple episodes of emesis as well.  Patient without fever.  No diarrhea or constipation.  No sick contacts.  States her abdomen is kind of tender all over.  She also has some dysuria. All the symptoms started over the last couple days.  Progressively worsened today which is what brought her in.  Has not tried anything for the symptoms.   Abdominal Pain      Past Medical History:  Diagnosis Date  . Depression   . Fibroids    removed during CS  . PTSD (post-traumatic stress disorder)     Patient Active Problem List   Diagnosis Date Noted  . Postop check d&C 07/13/2015  . Missed abortion 07/09/2015  . History of cesarean delivery 06/17/2015    Past Surgical History:  Procedure Laterality Date  . CESAREAN SECTION    . DILATION AND CURETTAGE OF UTERUS N/A 07/10/2015   Procedure: SUCTION DILATATION AND CURETTAGE;  Surgeon: [name], MD;  Location: [facility];  Service: Gynecology;  Laterality: N/A;     OB History    Gravida  4   Para  2   Term  2   Preterm      AB  1   Living  2     SAB  1   IAB      Ectopic      Multiple      Live Births  2        Obstetric Comments  NRFHT        Family History  Problem Relation Age of Onset  . Arthritis Mother   . Arthritis Father   . Alcohol abuse Father   . Depression Daughter   . Mental illness Maternal Grandmother   . Stroke Maternal Grandmother   . Osteoarthritis Paternal Grandmother   . Emphysema Paternal Grandfather     Social History   Tobacco Use  . Smoking status: Current Every Day Smoker    Packs/day: 0.50    Types: Cigarettes  . Smokeless tobacco: Never  Used  Vaping Use  . Vaping Use: Never used  Substance Use Topics  . Alcohol use: No  . Drug use: No    Home Medications Prior to Admission medications   Medication Sig Start Date End Date Taking? Authorizing Provider  alum & mag hydroxide-simeth (MAALOX ADVANCED MAX ST) 400-400-40 MG/5ML suspension Take 15 mLs by mouth every 6 (six) hours as needed for indigestion. 08/07/20  Yes Chrysa Rampy, Corene Cornea, MD  cephALEXin (KEFLEX) 500 MG capsule Take 1 capsule (500 mg total) by mouth 4 (four) times daily. 08/07/20  Yes Keirstan Iannello, Corene Cornea, MD  ondansetron (ZOFRAN ODT) 4 MG disintegrating tablet 4mg  ODT q4 hours prn nausea/vomit 08/07/20  Yes Baltasar Twilley, Corene Cornea, MD  pantoprazole (PROTONIX) 20 MG tablet Take 1 tablet (20 mg total) by mouth daily. 08/07/20  Yes Francesco Provencal, Corene Cornea, MD  acetaminophen (TYLENOL) 325 MG tablet Take 650 mg by mouth every 6 (six) hours as needed.    [provider]  HYDROcodone-acetaminophen (NORCO/VICODIN) 5-325 MG tablet Take 1 tablet by mouth every 4 (four) hours as needed for moderate pain. 01/09/17  Cuthriell, Charline Bills, PA-C  ibuprofen (ADVIL,MOTRIN) 600 MG tablet Take 1 tablet (600 mg total) by mouth every 8 (eight) hours as needed. 11/15/16   Johnn Hai, PA-C  meloxicam (MOBIC) 15 MG tablet Take 1 tablet (15 mg total) by mouth daily. 01/09/17   Cuthriell, Charline Bills, PA-C  penicillin v potassium (VEETID) 500 MG tablet Take 1 tablet (500 mg total) by mouth 4 (four) times daily. 11/15/16   Johnn Hai, PA-C    Allergies    Morphine and related  Review of Systems   Review of Systems  Gastrointestinal: Positive for abdominal pain.  All other systems reviewed and are negative.   Physical Exam Updated Vital Signs BP 125/82   Pulse 84   Temp 98.3 F (36.8 C) (Oral)   Resp 17   Ht 5\' 3"  (1.6 m)   Wt 106.6 kg   LMP 07/22/2020   SpO2 94%   BMI 41.63 kg/m   Physical Exam Vitals and nursing note reviewed.  Constitutional:      Appearance: She is well-developed.   HENT:     Head: Normocephalic and atraumatic.     Mouth/Throat:     Mouth: Mucous membranes are moist.     Pharynx: Oropharynx is clear.  Eyes:     Pupils: Pupils are equal, round, and reactive to light.  Cardiovascular:     Rate and Rhythm: Normal rate and regular rhythm.  Pulmonary:     Effort: No respiratory distress.     Breath sounds: No stridor.  Abdominal:     General: There is no distension.     Tenderness: There is abdominal tenderness in the epigastric area.  Musculoskeletal:        General: No swelling or tenderness. Normal range of motion.     Cervical back: Normal range of motion.  Skin:    General: Skin is warm and dry.  Neurological:     General: No focal deficit present.     Mental Status: She is alert.     ED Results / Procedures / Treatments   Labs (all labs ordered are listed, but only abnormal results are displayed) Labs Reviewed  COMPREHENSIVE METABOLIC PANEL - Abnormal; Notable for the following components:      Result Value   Glucose, Bld 106 (*)    All other components within normal limits  CBC - Abnormal; Notable for the following components:   WBC 12.7 (*)    All other components within normal limits  URINALYSIS, ROUTINE W REFLEX MICROSCOPIC - Abnormal; Notable for the following components:   APPearance HAZY (*)    Specific Gravity, Urine >1.030 (*)    Hgb urine dipstick SMALL (*)    All other components within normal limits  URINALYSIS, MICROSCOPIC (REFLEX) - Abnormal; Notable for the following components:   Bacteria, UA MANY (*)    All other components within normal limits  URINE CULTURE  LIPASE, BLOOD  PREGNANCY, URINE    EKG None  Radiology CT ABDOMEN PELVIS W CONTRAST  Result Date: 08/07/2020 CLINICAL DATA:  Abdominal pain for several days EXAM: CT ABDOMEN AND PELVIS WITH CONTRAST TECHNIQUE: Multidetector CT imaging of the abdomen and pelvis was performed using the standard protocol following bolus administration of intravenous  contrast. CONTRAST:  16mL OMNIPAQUE IOHEXOL 300 MG/ML  SOLN COMPARISON:  None. FINDINGS: Lower chest: No acute abnormality. Hepatobiliary: No focal liver abnormality is seen. No gallstones, gallbladder wall thickening, or biliary dilatation. Pancreas: Unremarkable. No pancreatic ductal dilatation or surrounding  inflammatory changes. Spleen: Normal in size without focal abnormality. Adrenals/Urinary Tract: Adrenal glands are within normal limits. Kidneys are well visualized bilaterally. No renal calculi or obstructive changes are noted. 5.4 cm cyst is noted in the lower pole of the right kidney. The bladder is decompressed. Stomach/Bowel: No obstructive or inflammatory changes of the colon are seen. The appendix is well visualized and fluid-filled with mild dilatation and 9 mm. No significant inflammatory changes are noted at this time although the possibility of early appendicitis could not be totally excluded given the elevated white blood cell count. Small bowel and stomach appear within normal limits. Vascular/Lymphatic: No significant vascular findings are present. No enlarged abdominal or pelvic lymph nodes. Reproductive: Uterus and bilateral adnexa are unremarkable. Other: No abdominal wall hernia or abnormality. No abdominopelvic ascites. Musculoskeletal: No acute or significant osseous findings. IMPRESSION: Fluid-filled and mildly prominent appendix without significant periappendiceal inflammatory changes. The possibility of early appendicitis deserves consideration. Right renal cyst. Electronically Signed   By: Inez Catalina M.D.   On: 08/07/2020 00:51    Procedures Procedures   Medications Ordered in ED Medications  ondansetron (ZOFRAN) injection 4 mg (4 mg Intravenous Given 08/06/20 2303)  metoCLOPramide (REGLAN) injection 10 mg (10 mg Intravenous Given 08/06/20 2339)  fentaNYL (SUBLIMAZE) injection 50 mcg (50 mcg Intravenous Given 08/06/20 2338)  pantoprazole (PROTONIX) EC tablet 40 mg (40 mg  Oral Given 08/06/20 2338)  lidocaine (XYLOCAINE) 2 % viscous mouth solution 15 mL (15 mLs Mouth/Throat Given 08/06/20 2338)  alum & mag hydroxide-simeth (MAALOX/MYLANTA) 200-200-20 MG/5ML suspension 30 mL (30 mLs Oral Given 08/06/20 2338)  iohexol (OMNIPAQUE) 300 MG/ML solution 100 mL (100 mLs Intravenous Contrast Given 08/07/20 0030)  cephALEXin (KEFLEX) capsule 1,000 mg (1,000 mg Oral Given 08/07/20 0150)    ED Course  I have reviewed the triage vital signs and the nursing notes.  Pertinent labs & imaging results that were available during my care of the patient were reviewed by me and considered in my medical decision making (see chart for details).    MDM Rules/Calculators/A&P                          Work-up is as above.  Likely UTI and possibly PUD as likely cause for symptoms.  She is significantly improved.  CTA has questionable appendicitis however pain is not that area nor does she have symptoms consistent with appendicitis.  I discussed with her about this and she is agrees with not get surgery involved at this point as it seems like a urinary tract infection is more likely she will monitor for symptoms of appendicitis and return if they occur.   Final Clinical Impression(s) / ED Diagnoses Final diagnoses:  RUQ abdominal pain  Acute cystitis without hematuria    Rx / DC Orders ED Discharge Orders         Ordered    pantoprazole (PROTONIX) 20 MG tablet  Daily        08/07/20 0143    ondansetron (ZOFRAN ODT) 4 MG disintegrating tablet        08/07/20 0143    alum & mag hydroxide-simeth (MAALOX ADVANCED MAX ST) 400-400-40 MG/5ML suspension  Every 6 hours PRN        08/07/20 0143    Ambulatory referral to Gastroenterology        08/07/20 0143    cephALEXin (KEFLEX) 500 MG capsule  4 times daily        08/07/20 0155  Toleen Lachapelle, Corene Cornea, MD 08/07/20 [phone]

## 2020-08-08 LAB — URINE CULTURE

## 2020-10-05 ENCOUNTER — Other Ambulatory Visit: Payer: Self-pay

## 2020-10-05 ENCOUNTER — Emergency Department (HOSPITAL_BASED_OUTPATIENT_CLINIC_OR_DEPARTMENT_OTHER)
Admission: EM | Admit: 2020-10-05 | Discharge: 2020-10-05 | Disposition: A | Discharge status: Home / Self Care | Payer: 59 | Attending: Emergency Medicine | Admitting: Emergency Medicine

## 2020-10-05 ENCOUNTER — Emergency Department (HOSPITAL_BASED_OUTPATIENT_CLINIC_OR_DEPARTMENT_OTHER): Payer: 59 | Admitting: Radiology

## 2020-10-05 ENCOUNTER — Encounter (HOSPITAL_BASED_OUTPATIENT_CLINIC_OR_DEPARTMENT_OTHER): Payer: Self-pay | Admitting: Emergency Medicine

## 2020-10-05 DIAGNOSIS — Z87891 Personal history of nicotine dependence: Secondary | ICD-10-CM | POA: Insufficient documentation

## 2020-10-05 DIAGNOSIS — S8991XA Unspecified injury of right lower leg, initial encounter: Secondary | ICD-10-CM | POA: Diagnosis present

## 2020-10-05 DIAGNOSIS — S79911A Unspecified injury of right hip, initial encounter: Secondary | ICD-10-CM | POA: Diagnosis not present

## 2020-10-05 DIAGNOSIS — W19XXXA Unspecified fall, initial encounter: Secondary | ICD-10-CM | POA: Diagnosis not present

## 2020-10-05 DIAGNOSIS — S8391XA Sprain of unspecified site of right knee, initial encounter: Secondary | ICD-10-CM | POA: Diagnosis not present

## 2020-10-05 DIAGNOSIS — M25461 Effusion, right knee: Secondary | ICD-10-CM | POA: Insufficient documentation

## 2020-10-05 NOTE — ED Provider Notes (Signed)
[facility] Provider Note   CSN: [id number] Arrival date & time: 10/05/20  1559     History Chief Complaint  Patient presents with   Knee Injury    Zoe Savage is a 35 y.o. female.  HPI Patient presents with right knee and right hip injury.  States that she was stepping down the stairs and stepped on the last stair and it tipped.  Her right knee bent out.  Complaining of severe pain in the knee now.  Has been able ambulate with pain however.  Also pain in right hip.  Did not fall and hit head.  Has had chronic pain in this knee.  States she had been told previously she had a meniscus injury.  Has not had surgery on the knee.  No ankle pain.  No back pain.    Past Medical History:  Diagnosis Date   Depression    Fibroids    removed during CS   PTSD (post-traumatic stress disorder)     Patient Active Problem List   Diagnosis Date Noted   Postop check d&C 07/13/2015   Missed abortion 07/09/2015   History of cesarean delivery 06/17/2015    Past Surgical History:  Procedure Laterality Date   CESAREAN SECTION     DILATION AND CURETTAGE OF UTERUS N/A 07/10/2015   Procedure: SUCTION DILATATION AND CURETTAGE;  Surgeon: [name], MD;  Location: [facility];  Service: Gynecology;  Laterality: N/A;     OB History     Gravida  4   Para  2   Term  2   Preterm      AB  1   Living  2      SAB  1   IAB      Ectopic      Multiple      Live Births  2        Obstetric Comments  NRFHT         Family History  Problem Relation Age of Onset   Arthritis Mother    Arthritis Father    Alcohol abuse Father    Depression Daughter    Mental illness Maternal Grandmother    Stroke Maternal Grandmother    Osteoarthritis Paternal Grandmother    Emphysema Paternal Grandfather     Social History   Tobacco Use   Smoking status: Former    Packs/day: 0.50    Pack years: 0.00    Types: Cigarettes   Smokeless tobacco: Never   Vaping Use   Vaping Use: Every day  Substance Use Topics   Alcohol use: No   Drug use: No    Home Medications Prior to Admission medications   Medication Sig Start Date End Date Taking? Authorizing Provider  acetaminophen (TYLENOL) 325 MG tablet Take 650 mg by mouth every 6 (six) hours as needed.    [provider]  alum & mag hydroxide-simeth (MAALOX ADVANCED MAX ST) 924-268-34 MG/5ML suspension Take 15 mLs by mouth every 6 (six) hours as needed for indigestion. 08/07/20   Mesner, Corene Cornea, MD  cephALEXin (KEFLEX) 500 MG capsule Take 1 capsule (500 mg total) by mouth 4 (four) times daily. 08/07/20   Mesner, Corene Cornea, MD  HYDROcodone-acetaminophen (NORCO/VICODIN) 5-325 MG tablet Take 1 tablet by mouth every 4 (four) hours as needed for moderate pain. 01/09/17   Cuthriell, Charline Bills, PA-C  ibuprofen (ADVIL,MOTRIN) 600 MG tablet Take 1 tablet (600 mg total) by mouth every 8 (eight) hours as needed. 11/15/16  Johnn Hai, PA-C  meloxicam (MOBIC) 15 MG tablet Take 1 tablet (15 mg total) by mouth daily. 01/09/17   Cuthriell, Charline Bills, PA-C  ondansetron (ZOFRAN ODT) 4 MG disintegrating tablet 4mg  ODT q4 hours prn nausea/vomit 08/07/20   Mesner, Corene Cornea, MD  pantoprazole (PROTONIX) 20 MG tablet Take 1 tablet (20 mg total) by mouth daily. 08/07/20   Mesner, Corene Cornea, MD  penicillin v potassium (VEETID) 500 MG tablet Take 1 tablet (500 mg total) by mouth 4 (four) times daily. 11/15/16   Johnn Hai, PA-C    Allergies    Morphine and related  Review of Systems   Review of Systems  Constitutional:  Negative for appetite change.  Respiratory:  Negative for shortness of breath.   Gastrointestinal:  Negative for abdominal pain.  Genitourinary:  Negative for flank pain.  Musculoskeletal:        Right hip right knee pain.  Neurological:  Negative for weakness and numbness.  Psychiatric/Behavioral:  Negative for confusion.    Physical Exam Updated Vital Signs BP 111/69 (BP Location: Right  Arm)   Pulse 82   Temp 98.3 F (36.8 C) (Oral)   Resp 16   Ht 5\' 3"  (1.6 m)   Wt 104.3 kg   LMP  (LMP Unknown)   SpO2 99%   BMI 40.74 kg/m   Physical Exam Vitals and nursing note reviewed.  HENT:     Head: Normocephalic.  Cardiovascular:     Rate and Rhythm: Regular rhythm.  Pulmonary:     Breath sounds: No rhonchi.  Abdominal:     Tenderness: There is no abdominal tenderness.  Musculoskeletal:     Cervical back: Neck supple.     Comments: Tenderness over right knee particularly medially.  May have small effusion.  Good extension.  Pain increased with valgus strain or varus strain.  Knee grossly appears stable.  Skin intact.  No tenderness over ankle.  Tenderness over right hip laterally.  Skin:    General: Skin is warm.  Neurological:     Mental Status: She is alert and oriented to person, place, and time.    ED Results / Procedures / Treatments   Labs (all labs ordered are listed, but only abnormal results are displayed) Labs Reviewed - No data to display  EKG None  Radiology DG Knee Complete 4 Views Right  Result Date: 10/05/2020 CLINICAL DATA:  Patient fell bending the knee in an awkward angle. Knee hurts to walk. Also with right hip pain. EXAM: RIGHT KNEE - COMPLETE 4+ VIEW COMPARISON:  None. FINDINGS: No fracture or bone lesion. Joint normally spaced and aligned. No significant degenerative/arthropathic change. Possible small joint effusion. IMPRESSION: 1. No fracture. Possible small joint effusion. No other abnormality. Electronically Signed   By: Lajean Manes M.D.   On: 10/05/2020 17:04   DG Hip Unilat W or Wo Pelvis 2-3 Views Right  Result Date: 10/05/2020 CLINICAL DATA:  Fall with right hip pain. EXAM: DG HIP (WITH OR WITHOUT PELVIS) 2-3V RIGHT COMPARISON:  None. FINDINGS: The cortical margins of the bony pelvis are intact. No fracture. Pubic symphysis and sacroiliac joints are congruent. Both femoral heads are well-seated in the respective acetabula. Right  hip joint spaces preserved. Regional soft tissues are unremarkable. IMPRESSION: Negative radiographs of the pelvis and right hip. Electronically Signed   By: Keith Rake M.D.   On: 10/05/2020 17:04    Procedures Procedures   Medications Ordered in ED Medications - No data to display  ED  Course  I have reviewed the triage vital signs and the nursing notes.  Pertinent labs & imaging results that were available during my care of the patient were reviewed by me and considered in my medical decision making (see chart for details).    MDM Rules/Calculators/A&P                          Patient with knee injury.  Right knee pain.  Has small effusion.  Knee stable.  X-ray only showed small effusion.  Will give knee immobilizer and crutches.  Has had some previous chronic knee pain.  States she does not have an Doctor, general practice.  Will let follow-up with sports medicine or Ortho.  Also some dull hip pain.  X-ray negative.  Discharge home. Final Clinical Impression(s) / ED Diagnoses Final diagnoses:  Sprain of right knee, unspecified ligament, initial encounter    Rx / DC Orders ED Discharge Orders     None        Davonna Belling, MD 10/05/20 2241

## 2020-10-05 NOTE — ED Triage Notes (Addendum)
Rt knee pain after falling and knee bent to the side hurts to walk on it, rt hip pain also

## 2020-10-05 NOTE — Discharge Instructions (Addendum)
Your x-ray showed only potentially a small effusion.  The knee immobilizer may help.  Follow-up with either orthopedic surgery or potentially sports medicine.

## 2020-11-17 ENCOUNTER — Emergency Department
Admission: EM | Admit: 2020-11-17 | Discharge: 2020-11-17 | Disposition: A | Discharge status: Home / Self Care | Payer: 59 | Attending: Emergency Medicine | Admitting: Emergency Medicine

## 2020-11-17 ENCOUNTER — Other Ambulatory Visit: Payer: Self-pay

## 2020-11-17 DIAGNOSIS — Z87891 Personal history of nicotine dependence: Secondary | ICD-10-CM | POA: Diagnosis not present

## 2020-11-17 DIAGNOSIS — U071 COVID-19: Secondary | ICD-10-CM | POA: Diagnosis not present

## 2020-11-17 DIAGNOSIS — R519 Headache, unspecified: Secondary | ICD-10-CM | POA: Diagnosis present

## 2020-11-17 LAB — RESP PANEL BY RT-PCR (FLU A&B, COVID) ARPGX2
Influenza A by PCR: NEGATIVE
Influenza B by PCR: NEGATIVE
SARS Coronavirus 2 by RT PCR: POSITIVE — AB

## 2020-11-17 MED ORDER — BENZONATATE 100 MG PO CAPS: 100.0000 mg | ORAL_CAPSULE | Freq: Three times a day (TID) | ORAL | 0 refills | Status: AC | PRN

## 2020-11-17 NOTE — ED Provider Notes (Signed)
[facility]-EMERGENCY DEPARTMENT  ____________________________________________  Time seen: Approximately 6:12 PM  I have reviewed the triage vital signs and the nursing notes.   HISTORY  Chief Complaint Generalized Body Aches   Historian Patient     HPI Zoe Savage is a 35 y.o. female presents to the emergency department with headache, body aches, fever and generalized malaise that started last night.  Patient has had some nausea and one episode of emesis.  She denies chest pain, chest tightness or shortness of breath.  She has numerous potential sick contacts at work.   Past Medical History:  Diagnosis Date   Depression    Fibroids    removed during CS   PTSD (post-traumatic stress disorder)      Immunizations up to date:  Yes.     Past Medical History:  Diagnosis Date   Depression    Fibroids    removed during CS   PTSD (post-traumatic stress disorder)     Patient Active Problem List   Diagnosis Date Noted   Postop check d&C 07/13/2015   Missed abortion 07/09/2015   History of cesarean delivery 06/17/2015    Past Surgical History:  Procedure Laterality Date   CESAREAN SECTION     DILATION AND CURETTAGE OF UTERUS N/A 07/10/2015   Procedure: SUCTION DILATATION AND CURETTAGE;  Surgeon: [name], MD;  Location: [facility];  Service: Gynecology;  Laterality: N/A;    Prior to Admission medications   Medication Sig Start Date End Date Taking? Authorizing Provider  benzonatate (TESSALON PERLES) 100 MG capsule Take 1 capsule (100 mg total) by mouth 3 (three) times daily as needed for up to 7 days for cough. 11/17/20 11/24/20 Yes Vallarie Mare M, PA-C  acetaminophen (TYLENOL) 325 MG tablet Take 650 mg by mouth every 6 (six) hours as needed.    [provider]  alum & mag hydroxide-simeth (MAALOX ADVANCED MAX ST) [id number] MG/5ML suspension Take 15 mLs by mouth every 6 (six) hours as needed for indigestion. 08/07/20   Mesner, Corene Cornea, MD  cephALEXin  (KEFLEX) 500 MG capsule Take 1 capsule (500 mg total) by mouth 4 (four) times daily. 08/07/20   Mesner, Corene Cornea, MD  HYDROcodone-acetaminophen (NORCO/VICODIN) 5-325 MG tablet Take 1 tablet by mouth every 4 (four) hours as needed for moderate pain. 01/09/17   Cuthriell, Charline Bills, PA-C  ibuprofen (ADVIL,MOTRIN) 600 MG tablet Take 1 tablet (600 mg total) by mouth every 8 (eight) hours as needed. 11/15/16   Johnn Hai, PA-C  meloxicam (MOBIC) 15 MG tablet Take 1 tablet (15 mg total) by mouth daily. 01/09/17   Cuthriell, Charline Bills, PA-C  ondansetron (ZOFRAN ODT) 4 MG disintegrating tablet '4mg'$  ODT q4 hours prn nausea/vomit 08/07/20   Mesner, Corene Cornea, MD  pantoprazole (PROTONIX) 20 MG tablet Take 1 tablet (20 mg total) by mouth daily. 08/07/20   Mesner, Corene Cornea, MD  penicillin v potassium (VEETID) 500 MG tablet Take 1 tablet (500 mg total) by mouth 4 (four) times daily. 11/15/16   Johnn Hai, PA-C    Allergies Morphine and related  Family History  Problem Relation Age of Onset   Arthritis Mother    Arthritis Father    Alcohol abuse Father    Depression Daughter    Mental illness Maternal Grandmother    Stroke Maternal Grandmother    Osteoarthritis Paternal Grandmother    Emphysema Paternal Grandfather     Social History Social History   Tobacco Use   Smoking status: Former    Packs/day:  0.50    Types: Cigarettes   Smokeless tobacco: Never  Vaping Use   Vaping Use: Every day  Substance Use Topics   Alcohol use: No   Drug use: No     Review of Systems  Constitutional: Patient has fever.  Eyes: No visual changes. No discharge ENT: Patient has congestion.  Cardiovascular: no chest pain. Respiratory: Patient has cough.  Gastrointestinal: No abdominal pain.  No nausea, no vomiting. Patient had diarrhea.  Genitourinary: Negative for dysuria. No hematuria Musculoskeletal: Patient has myalgias.  Skin: Negative for rash, abrasions, lacerations, ecchymosis. Neurological: Patient  has headache, no focal weakness or numbness.   ____________________________________________   PHYSICAL EXAM:  VITAL SIGNS: ED Triage Vitals [11/17/20 1615]  Enc Vitals Group     BP (!) 129/95     Pulse Rate 100     Resp 18     Temp 99.9 F (37.7 C)     Temp Source Oral     SpO2 99 %     Weight 250 lb (113.4 kg)     Height '5\' 2"'$  (1.575 m)     Head Circumference      Peak Flow      Pain Score 5     Pain Loc      Pain Edu?      Excl. in [place]?      Constitutional: Alert and oriented. Patient is lying supine. Eyes: Conjunctivae are normal. PERRL. EOMI. Head: Atraumatic. ENT:      Ears: Tympanic membranes are mildly injected with mild effusion bilaterally.       Nose: No congestion/rhinnorhea.      Mouth/Throat: Mucous membranes are moist. Posterior pharynx is mildly erythematous.  Hematological/Lymphatic/Immunilogical: No cervical lymphadenopathy.  Cardiovascular: Normal rate, regular rhythm. Normal S1 and S2.  Good peripheral circulation. Respiratory: Normal respiratory effort without tachypnea or retractions. Lungs CTAB. Good air entry to the bases with no decreased or absent breath sounds. Gastrointestinal: Bowel sounds 4 quadrants. Soft and nontender to palpation. No guarding or rigidity. No palpable masses. No distention. No CVA tenderness. Musculoskeletal: Full range of motion to all extremities. No gross deformities appreciated. Neurologic:  Normal speech and language. No gross focal neurologic deficits are appreciated.  Skin:  Skin is warm, dry and intact. No rash noted. Psychiatric: Mood and affect are normal. Speech and behavior are normal. Patient exhibits appropriate insight and judgement.   ____________________________________________   LABS (all labs ordered are listed, but only abnormal results are displayed)  Labs Reviewed  RESP PANEL BY RT-PCR (FLU A&B, COVID) ARPGX2 - Abnormal; Notable for the following components:      Result Value   SARS Coronavirus  2 by RT PCR POSITIVE (*)    All other components within normal limits   ____________________________________________  EKG   ____________________________________________  RADIOLOGY   No results found.  ____________________________________________    PROCEDURES  Procedure(s) performed:     Procedures     Medications - No data to display   ____________________________________________   INITIAL IMPRESSION / ASSESSMENT AND PLAN / ED COURSE  Pertinent labs & imaging results that were available during my care of the patient were reviewed by me and considered in my medical decision making (see chart for details).      Assessment and Plan:  COVID-30 35 year old female presents to the emergency department with fever, headache and body aches that started last night.  Patient has also had some nausea and vomiting.  She tested positive for COVID-19 today.  Rest  and hydration were encouraged at home.  Tylenol and ibuprofen alternating for fever were recommended.  Return precautions were given to return with new or worsening symptoms.  All patient questions were answered.      ____________________________________________  FINAL CLINICAL IMPRESSION(S) / ED DIAGNOSES  Final diagnoses:  COVID-19      NEW MEDICATIONS STARTED DURING THIS VISIT:  ED Discharge Orders          Ordered    benzonatate (TESSALON PERLES) 100 MG capsule  3 times daily PRN        11/17/20 1804                This chart was dictated using voice recognition software/Dragon. Despite best efforts to proofread, errors can occur which can change the meaning. Any change was purely unintentional.     Lannie Fields, PA-C 11/17/20 1821    Vanessa Sunwest, MD 11/18/20 [phone]

## 2020-11-17 NOTE — ED Triage Notes (Addendum)
Pt to ER via POV with complaints of a fever that started last night and generalized body aches. Reports symptoms started last night. Also reports vomiting x1 last night, has not vomited today. Denies shortness of breath/ chest pain.   Unknown covid contacts.  Last took tylenol approx 2 hours ago. Reports temp as high as 102.

## 2020-11-17 NOTE — Discharge Instructions (Addendum)
Take Tylenol and Ibuprofen alternating for fever.

## 2020-12-20 ENCOUNTER — Other Ambulatory Visit: Payer: Self-pay

## 2020-12-20 ENCOUNTER — Encounter (HOSPITAL_BASED_OUTPATIENT_CLINIC_OR_DEPARTMENT_OTHER): Payer: Self-pay | Admitting: Obstetrics and Gynecology

## 2020-12-20 ENCOUNTER — Emergency Department (HOSPITAL_BASED_OUTPATIENT_CLINIC_OR_DEPARTMENT_OTHER)
Admission: EM | Admit: 2020-12-20 | Discharge: 2020-12-20 | Disposition: A | Discharge status: Home / Self Care | Payer: 59 | Attending: Emergency Medicine | Admitting: Emergency Medicine

## 2020-12-20 DIAGNOSIS — K0889 Other specified disorders of teeth and supporting structures: Secondary | ICD-10-CM

## 2020-12-20 DIAGNOSIS — Z87891 Personal history of nicotine dependence: Secondary | ICD-10-CM | POA: Insufficient documentation

## 2020-12-20 MED ORDER — PENICILLIN V POTASSIUM 500 MG PO TABS: 500.0000 mg | ORAL_TABLET | Freq: Four times a day (QID) | ORAL | 0 refills | Status: AC

## 2020-12-20 MED ORDER — NAPROXEN 250 MG PO TABS
500.0000 mg | ORAL_TABLET | Freq: Once | ORAL | Status: AC
  Administered 2020-12-20: 500 mg via ORAL
  Filled 2020-12-20: qty 2

## 2020-12-20 MED ORDER — NAPROXEN 500 MG PO TABS
500.0000 mg | ORAL_TABLET | Freq: Two times a day (BID) | ORAL | 0 refills | Status: DC | PRN
Start: 2020-12-20 — End: 2021-01-21

## 2020-12-20 MED ORDER — HYDROCODONE-ACETAMINOPHEN 5-325 MG PO TABS
1.0000 | ORAL_TABLET | Freq: Once | ORAL | Status: AC
  Administered 2020-12-20: 1 via ORAL
  Filled 2020-12-20: qty 1

## 2020-12-20 MED ORDER — PENICILLIN V POTASSIUM 250 MG PO TABS
500.0000 mg | ORAL_TABLET | Freq: Once | ORAL | Status: AC
  Administered 2020-12-20: 500 mg via ORAL
  Filled 2020-12-20: qty 2

## 2020-12-20 NOTE — ED Provider Notes (Signed)
[place] [facility] EMERGENCY DEPT Provider Note   CSN: [id number] Arrival date & time: 12/20/20  2217     History Chief Complaint  Patient presents with   Dental Pain    Zoe Savage is a 35 y.o. female.  Intermittent left lower dental pain for the past several weeks.  Increased pain over the past 2 days.  States the tooth is cracked and "black".  Denies any bleeding or drainage.  Some difficulty swallowing.  No difficulty breathing.  No chest pain or shortness of breath.  No cough or fever.  Taking ibuprofen without relief.  Does not have a dentist.  Denies any history of abdominal pain, nausea or vomiting.  Pain became worse after eating this weekend.  The history is provided by the patient.  Dental Pain Associated symptoms: no fever and no headaches       Past Medical History:  Diagnosis Date   Depression    Fibroids    removed during CS   PTSD (post-traumatic stress disorder)     Patient Active Problem List   Diagnosis Date Noted   Postop check d&C 07/13/2015   Missed abortion 07/09/2015   History of cesarean delivery 06/17/2015    Past Surgical History:  Procedure Laterality Date   CESAREAN SECTION     DILATION AND CURETTAGE OF UTERUS N/A 07/10/2015   Procedure: SUCTION DILATATION AND CURETTAGE;  Surgeon: [name], MD;  Location: [facility];  Service: Gynecology;  Laterality: N/A;     OB History     Gravida  4   Para  2   Term  2   Preterm      AB  1   Living  2      SAB  1   IAB      Ectopic      Multiple      Live Births  2        Obstetric Comments  NRFHT         Family History  Problem Relation Age of Onset   Arthritis Mother    Arthritis Father    Alcohol abuse Father    Depression Daughter    Mental illness Maternal Grandmother    Stroke Maternal Grandmother    Osteoarthritis Paternal Grandmother    Emphysema Paternal Grandfather     Social History   Tobacco Use   Smoking status: Former     Packs/day: 0.50    Types: Cigarettes   Smokeless tobacco: Never  Vaping Use   Vaping Use: Every day  Substance Use Topics   Alcohol use: No   Drug use: No    Home Medications Prior to Admission medications   Medication Sig Start Date End Date Taking? Authorizing Provider  acetaminophen (TYLENOL) 325 MG tablet Take 650 mg by mouth every 6 (six) hours as needed.    [provider]  alum & mag hydroxide-simeth (MAALOX ADVANCED MAX ST) [id number] MG/5ML suspension Take 15 mLs by mouth every 6 (six) hours as needed for indigestion. 08/07/20   Mesner, Corene Cornea, MD  cephALEXin (KEFLEX) 500 MG capsule Take 1 capsule (500 mg total) by mouth 4 (four) times daily. 08/07/20   Mesner, Corene Cornea, MD  HYDROcodone-acetaminophen (NORCO/VICODIN) 5-325 MG tablet Take 1 tablet by mouth every 4 (four) hours as needed for moderate pain. 01/09/17   Cuthriell, Charline Bills, PA-C  ibuprofen (ADVIL,MOTRIN) 600 MG tablet Take 1 tablet (600 mg total) by mouth every 8 (eight) hours as needed. 11/15/16   Madalyn Rob,  Rhonda L, PA-C  meloxicam (MOBIC) 15 MG tablet Take 1 tablet (15 mg total) by mouth daily. 01/09/17   Cuthriell, Charline Bills, PA-C  ondansetron (ZOFRAN ODT) 4 MG disintegrating tablet '4mg'$  ODT q4 hours prn nausea/vomit 08/07/20   Mesner, Corene Cornea, MD  pantoprazole (PROTONIX) 20 MG tablet Take 1 tablet (20 mg total) by mouth daily. 08/07/20   Mesner, Corene Cornea, MD  penicillin v potassium (VEETID) 500 MG tablet Take 1 tablet (500 mg total) by mouth 4 (four) times daily. 11/15/16   Johnn Hai, PA-C    Allergies    Morphine and related  Review of Systems   Review of Systems  Constitutional:  Negative for activity change, appetite change and fever.  HENT:  Positive for dental problem. Negative for sinus pressure and sore throat.   Respiratory:  Negative for cough, chest tightness and shortness of breath.   Cardiovascular:  Negative for chest pain.  Gastrointestinal:  Negative for abdominal pain, nausea and vomiting.   Genitourinary:  Negative for dysuria and hematuria.  Musculoskeletal:  Negative for arthralgias and myalgias.  Skin:  Negative for rash.  Neurological:  Negative for dizziness, weakness and headaches.   all other systems are negative except as noted in the HPI and PMH.   Physical Exam Updated Vital Signs BP 135/87 (BP Location: Right Arm)   Pulse 77   Temp 98.4 F (36.9 C)   Resp 17   Ht '5\' 3"'$  (1.6 m)   Wt 104.3 kg   LMP 12/02/2020 (Approximate)   SpO2 97%   Breastfeeding No   BMI 40.74 kg/m   Physical Exam Vitals and nursing note reviewed.  Constitutional:      General: She is not in acute distress.    Appearance: She is well-developed.  HENT:     Head: Normocephalic and atraumatic.     Mouth/Throat:     Pharynx: No oropharyngeal exudate.     Comments: Left lower molar is broken off at the gumline.  Diffuse caries with black discoloration.  No fluctuance along gingiva.  Floor mouth is soft. No malocclusion or trismus Eyes:     Conjunctiva/sclera: Conjunctivae normal.     Pupils: Pupils are equal, round, and reactive to light.  Neck:     Comments: No meningismus. Cardiovascular:     Rate and Rhythm: Normal rate and regular rhythm.     Heart sounds: Normal heart sounds. No murmur heard. Pulmonary:     Effort: Pulmonary effort is normal. No respiratory distress.     Breath sounds: Normal breath sounds.  Chest:     Chest wall: No tenderness.  Abdominal:     Palpations: Abdomen is soft.     Tenderness: There is no abdominal tenderness. There is no guarding or rebound.  Musculoskeletal:        General: No tenderness. Normal range of motion.     Cervical back: Normal range of motion and neck supple.  Skin:    General: Skin is warm.  Neurological:     Mental Status: She is alert and oriented to person, place, and time.     Cranial Nerves: No cranial nerve deficit.     Motor: No abnormal muscle tone.     Coordination: Coordination normal.     Comments:  5/5  strength throughout. CN 2-12 intact.Equal grip strength.   Psychiatric:        Behavior: Behavior normal.    ED Results / Procedures / Treatments   Labs (all labs ordered are listed,  but only abnormal results are displayed) Labs Reviewed - No data to display  EKG None  Radiology No results found.  Procedures Procedures   Medications Ordered in ED Medications  HYDROcodone-acetaminophen (NORCO/VICODIN) 5-325 MG per tablet 1 tablet (has no administration in time range)  penicillin v potassium (VEETID) tablet 500 mg (has no administration in time range)  naproxen (NAPROSYN) tablet 500 mg (has no administration in time range)    ED Course  I have reviewed the triage vital signs and the nursing notes.  Pertinent labs & imaging results that were available during my care of the patient were reviewed by me and considered in my medical decision making (see chart for details).    MDM Rules/Calculators/A&P                           Dental pain without evidence of abscess or Ludwig's angina.  Vital stable, no fever.  Discussed with patient she needs to see dentist soon as possible to have tooth extraction.  Will give prophylactic antibiotics and pain control.  Resources given.  Return precautions discussed Final Clinical Impression(s) / ED Diagnoses Final diagnoses:  Pain, dental    Rx / DC Orders ED Discharge Orders     None        Ezzard Ditmer, Annie Main, MD 12/20/20 2348

## 2020-12-20 NOTE — ED Triage Notes (Signed)
Patient reports to the ER for Left lower jaw pain from a reported cracked tooth. Patient reports being unable to afford having the tooth pulled.

## 2020-12-20 NOTE — Discharge Instructions (Addendum)
Please antibiotics as prescribed.  Call the dentist for an appointment as soon as possible as you need to have this tooth extracted.  Return to the ED with difficulty breathing, difficulty swallowing, or any other concerns.

## 2021-01-21 ENCOUNTER — Ambulatory Visit
Admission: EM | Admit: 2021-01-21 | Discharge: 2021-01-21 | Disposition: A | Discharge status: Home / Self Care | Payer: Medicaid Other

## 2021-01-21 ENCOUNTER — Other Ambulatory Visit: Payer: Self-pay

## 2021-01-21 ENCOUNTER — Encounter: Payer: Self-pay | Admitting: Emergency Medicine

## 2021-01-21 DIAGNOSIS — R070 Pain in throat: Secondary | ICD-10-CM

## 2021-01-21 DIAGNOSIS — J069 Acute upper respiratory infection, unspecified: Secondary | ICD-10-CM

## 2021-01-21 DIAGNOSIS — R0981 Nasal congestion: Secondary | ICD-10-CM

## 2021-01-21 MED ORDER — CETIRIZINE HCL 10 MG PO TABS: 10.0000 mg | ORAL_TABLET | Freq: Every day | ORAL | 0 refills | Status: DC

## 2021-01-21 MED ORDER — IPRATROPIUM BROMIDE 0.03 % NA SOLN: 2.0000 | Freq: Two times a day (BID) | NASAL | 0 refills | Status: DC

## 2021-01-21 MED ORDER — PROMETHAZINE-DM 6.25-15 MG/5ML PO SYRP: 5.0000 mL | ORAL_SOLUTION | Freq: Every evening | ORAL | 0 refills | Status: DC | PRN

## 2021-01-21 MED ORDER — PSEUDOEPHEDRINE HCL 60 MG PO TABS: 60.0000 mg | ORAL_TABLET | Freq: Three times a day (TID) | ORAL | 0 refills | Status: DC | PRN

## 2021-01-21 MED ORDER — BENZONATATE 100 MG PO CAPS: 100.0000 mg | ORAL_CAPSULE | Freq: Three times a day (TID) | ORAL | 0 refills | Status: DC | PRN

## 2021-01-21 NOTE — ED Provider Notes (Signed)
[facility]   MRN: [id number] DOB: [birth year]  Subjective:   Zoe Savage is a 35 y.o. female presenting for 1 day history of acute onset persistent sinus pressure, sinus congestion, throat pain, cough, right ear pain.  Patient just quit smoking 3 months ago.  Denies chest pain, shortness of breath, wheezing.  Has been using DayQuil with minimal relief.  Has done 2 COVID tests and both were negative.  Does not want a repeat.  No current facility-administered medications for this encounter.  Current Outpatient Medications:    acetaminophen (TYLENOL) 325 MG tablet, Take 650 mg by mouth every 6 (six) hours as needed., Disp: , Rfl:    DULoxetine (CYMBALTA) 30 MG capsule, Take by mouth., Disp: , Rfl:    metFORMIN (GLUCOPHAGE) 500 MG tablet, Take by mouth., Disp: , Rfl:    alum & mag hydroxide-simeth (MAALOX ADVANCED MAX ST) 127-517-00 MG/5ML suspension, Take 15 mLs by mouth every 6 (six) hours as needed for indigestion., Disp: 355 mL, Rfl: 0   cephALEXin (KEFLEX) 500 MG capsule, Take 1 capsule (500 mg total) by mouth 4 (four) times daily., Disp: 20 capsule, Rfl: 0   HYDROcodone-acetaminophen (NORCO/VICODIN) 5-325 MG tablet, Take 1 tablet by mouth every 4 (four) hours as needed for moderate pain., Disp: 12 tablet, Rfl: 0   ibuprofen (ADVIL,MOTRIN) 600 MG tablet, Take 1 tablet (600 mg total) by mouth every 8 (eight) hours as needed., Disp: 30 tablet, Rfl: 0   meloxicam (MOBIC) 15 MG tablet, Take 1 tablet (15 mg total) by mouth daily., Disp: 30 tablet, Rfl: 1   naproxen (NAPROSYN) 500 MG tablet, Take 1 tablet (500 mg total) by mouth 2 (two) times daily as needed., Disp: 30 tablet, Rfl: 0   ondansetron (ZOFRAN ODT) 4 MG disintegrating tablet, 4mg  ODT q4 hours prn nausea/vomit, Disp: 4 tablet, Rfl: 0   pantoprazole (PROTONIX) 20 MG tablet, Take 1 tablet (20 mg total) by mouth daily., Disp: 15 tablet, Rfl: 0   Allergies  Allergen Reactions   Morphine And Related Itching and Rash     Past Medical History:  Diagnosis Date   Depression    Fibroids    removed during CS   PTSD (post-traumatic stress disorder)      Past Surgical History:  Procedure Laterality Date   CESAREAN SECTION     DILATION AND CURETTAGE OF UTERUS N/A 07/10/2015   Procedure: SUCTION DILATATION AND CURETTAGE;  Surgeon: [name], MD;  Location: [facility];  Service: Gynecology;  Laterality: N/A;    Family History  Problem Relation Age of Onset   Arthritis Mother    Arthritis Father    Alcohol abuse Father    Depression Daughter    Mental illness Maternal Grandmother    Stroke Maternal Grandmother    Osteoarthritis Paternal Grandmother    Emphysema Paternal Grandfather     Social History   Tobacco Use   Smoking status: Former    Packs/day: 0.50    Types: Cigarettes   Smokeless tobacco: Never  Vaping Use   Vaping Use: Every day  Substance Use Topics   Alcohol use: No   Drug use: No    ROS   Objective:   Vitals: BP 121/82 (BP Location: Left Arm)   Pulse 76   Temp 97.6 F (36.4 C) (Oral)   Ht 5\' 2"  (1.575 m)   Wt 250 lb (113.4 kg)   SpO2 98%   BMI 45.73 kg/m   Physical Exam Constitutional:  General: She is not in acute distress.    Appearance: Normal appearance. She is well-developed. She is not ill-appearing, toxic-appearing or diaphoretic.  HENT:     Head: Normocephalic and atraumatic.     Right Ear: Tympanic membrane and ear canal normal. No drainage or tenderness. No middle ear effusion. Tympanic membrane is not erythematous.     Left Ear: Tympanic membrane and ear canal normal. No drainage or tenderness.  No middle ear effusion. Tympanic membrane is not erythematous.     Nose: Congestion and rhinorrhea present.     Mouth/Throat:     Mouth: Mucous membranes are moist. No oral lesions.     Pharynx: Oropharynx is clear. No pharyngeal swelling, oropharyngeal exudate, posterior oropharyngeal erythema or uvula swelling.     Tonsils: No tonsillar exudate or  tonsillar abscesses.  Eyes:     General: No scleral icterus.       Right eye: No discharge.        Left eye: No discharge.     Extraocular Movements: Extraocular movements intact.     Right eye: Normal extraocular motion.     Left eye: Normal extraocular motion.     Conjunctiva/sclera: Conjunctivae normal.     Pupils: Pupils are equal, round, and reactive to light.  Cardiovascular:     Rate and Rhythm: Normal rate and regular rhythm.     Pulses: Normal pulses.     Heart sounds: Normal heart sounds. No murmur heard.   No friction rub. No gallop.  Pulmonary:     Effort: Pulmonary effort is normal. No respiratory distress.     Breath sounds: Normal breath sounds. No stridor. No wheezing, rhonchi or rales.  Musculoskeletal:     Cervical back: Normal range of motion and neck supple.  Lymphadenopathy:     Cervical: No cervical adenopathy.  Skin:    General: Skin is warm and dry.     Findings: No rash.  Neurological:     General: No focal deficit present.     Mental Status: She is alert and oriented to person, place, and time.  Psychiatric:        Mood and Affect: Mood normal.        Behavior: Behavior normal.        Thought Content: Thought content normal.        Judgment: Judgment normal.     Assessment and Plan :   PDMP not reviewed this encounter.  1. Viral URI with cough   2. Throat pain   3. Nasal congestion     Suspect viral URI, viral syndrome; physical exam findings reassuring and vital signs stable for discharge. Advised supportive care, offered symptomatic relief. Counseled patient on potential for adverse effects with medications prescribed/recommended today, ER and return-to-clinic precautions discussed, patient verbalized understanding.     Jaynee Eagles, PA-C 01/21/21 1810

## 2021-01-21 NOTE — ED Triage Notes (Signed)
Patient c/o non-productive cough, nasal congestion, itchy throat since yesterday.  Patient is taken Dayquil.  Patient is not vaccinated for COVID.

## 2021-03-03 ENCOUNTER — Ambulatory Visit
Admission: RE | Admit: 2021-03-03 | Discharge: 2021-03-03 | Disposition: A | Discharge status: Home / Self Care | Payer: Medicaid Other | Source: Ambulatory Visit | Attending: Physician Assistant | Admitting: Physician Assistant

## 2021-03-03 ENCOUNTER — Other Ambulatory Visit: Payer: Self-pay

## 2021-03-03 VITALS — BP 139/84 | HR 81 | Temp 98.2°F | Resp 18

## 2021-03-03 DIAGNOSIS — R112 Nausea with vomiting, unspecified: Secondary | ICD-10-CM | POA: Diagnosis not present

## 2021-03-03 DIAGNOSIS — R6889 Other general symptoms and signs: Secondary | ICD-10-CM

## 2021-03-03 DIAGNOSIS — Z20828 Contact with and (suspected) exposure to other viral communicable diseases: Secondary | ICD-10-CM | POA: Diagnosis not present

## 2021-03-03 LAB — POCT INFLUENZA A/B
Influenza A, POC: NEGATIVE
Influenza B, POC: NEGATIVE

## 2021-03-03 MED ORDER — OSELTAMIVIR PHOSPHATE 75 MG PO CAPS: 75.0000 mg | ORAL_CAPSULE | Freq: Two times a day (BID) | ORAL | 0 refills | Status: AC

## 2021-03-03 MED ORDER — ONDANSETRON 4 MG PO TBDP: 4.0000 mg | ORAL_TABLET | Freq: Three times a day (TID) | ORAL | 0 refills | Status: AC | PRN

## 2021-03-03 NOTE — ED Provider Notes (Signed)
[facility]    CSN: [id number] Arrival date & time: 03/03/21  1254      History   Chief Complaint Chief Complaint  Patient presents with   appt 1 - cough    HPI Zoe Savage is a 35 y.o. female.   Patient here today for evaluation of headache, cough, nausea, vomiting and diarrhea that started yesterday.  She has also had sore throat.  She has tried over-the-counter medication without significant relief.  She has had known flu exposure.  The history is provided by the patient.   Past Medical History:  Diagnosis Date   Depression    Fibroids    removed during CS   PTSD (post-traumatic stress disorder)     Patient Active Problem List   Diagnosis Date Noted   Postop check d&C 07/13/2015   Missed abortion 07/09/2015   History of cesarean delivery 06/17/2015    Past Surgical History:  Procedure Laterality Date   CESAREAN SECTION     DILATION AND CURETTAGE OF UTERUS N/A 07/10/2015   Procedure: SUCTION DILATATION AND CURETTAGE;  Surgeon: [name], MD;  Location: [facility];  Service: Gynecology;  Laterality: N/A;    OB History     Gravida  4   Para  2   Term  2   Preterm      AB  1   Living  2      SAB  1   IAB      Ectopic      Multiple      Live Births  2        Obstetric Comments  NRFHT          Home Medications    Prior to Admission medications   Medication Sig Start Date End Date Taking? Authorizing Provider  ondansetron (ZOFRAN-ODT) 4 MG disintegrating tablet Take 1 tablet (4 mg total) by mouth every 8 (eight) hours as needed for nausea or vomiting. 03/03/21  Yes Francene Finders, PA-C  oseltamivir (TAMIFLU) 75 MG capsule Take 1 capsule (75 mg total) by mouth every 12 (twelve) hours. 03/03/21  Yes Francene Finders, PA-C  acetaminophen (TYLENOL) 325 MG tablet Take 650 mg by mouth every 6 (six) hours as needed.    [provider]  DULoxetine (CYMBALTA) 30 MG capsule Take by mouth. 04/09/20 08/19/21   [provider]  metFORMIN (GLUCOPHAGE) 500 MG tablet Take by mouth. 04/22/20 08/19/21  [provider]    Family History Family History  Problem Relation Age of Onset   Arthritis Mother    Arthritis Father    Alcohol abuse Father    Depression Daughter    Mental illness Maternal Grandmother    Stroke Maternal Grandmother    Osteoarthritis Paternal Grandmother    Emphysema Paternal Grandfather     Social History Social History   Tobacco Use   Smoking status: Former    Packs/day: 0.50    Types: Cigarettes   Smokeless tobacco: Never  Vaping Use   Vaping Use: Every day  Substance Use Topics   Alcohol use: No   Drug use: No     Allergies   Morphine and related   Review of Systems Review of Systems  Constitutional:  Negative for chills and fever.  HENT:  Positive for congestion, sinus pressure and sore throat. Negative for ear pain.   Eyes:  Negative for discharge and redness.  Respiratory:  Positive for cough. Negative for shortness of breath and wheezing.  Gastrointestinal:  Positive for diarrhea, nausea and vomiting. Negative for abdominal pain.    Physical Exam Triage Vital Signs ED Triage Vitals [03/03/21 1319]  Enc Vitals Group     BP 139/84     Pulse Rate 81     Resp 18     Temp 98.2 F (36.8 C)     Temp Source Oral     SpO2 96 %     Weight      Height      Head Circumference      Peak Flow      Pain Score 3     Pain Loc      Pain Edu?      Excl. in [place]?    No data found.  Updated Vital Signs BP 139/84 (BP Location: Left Arm)   Pulse 81   Temp 98.2 F (36.8 C) (Oral)   Resp 18   LMP 02/24/2021   SpO2 96%   Physical Exam Vitals and nursing note reviewed.  Constitutional:      General: She is not in acute distress.    Appearance: Normal appearance. She is not ill-appearing.  HENT:     Head: Normocephalic and atraumatic.     Nose: Congestion present.     Mouth/Throat:     Mouth: Mucous membranes are moist.      Pharynx: Posterior oropharyngeal erythema present. No oropharyngeal exudate.     Comments: Tonsils 3 + bilaterally, hoarse voice Eyes:     Conjunctiva/sclera: Conjunctivae normal.  Cardiovascular:     Rate and Rhythm: Normal rate and regular rhythm.     Heart sounds: Normal heart sounds. No murmur heard. Pulmonary:     Effort: Pulmonary effort is normal. No respiratory distress.     Breath sounds: Normal breath sounds. No wheezing, rhonchi or rales.  Skin:    General: Skin is warm and dry.  Neurological:     Mental Status: She is alert.  Psychiatric:        Mood and Affect: Mood normal.        Thought Content: Thought content normal.     UC Treatments / Results  Labs (all labs ordered are listed, but only abnormal results are displayed) Labs Reviewed  POCT INFLUENZA A/B    EKG   Radiology No results found.  Procedures Procedures (including critical care time)  Medications Ordered in UC Medications - No data to display  Initial Impression / Assessment and Plan / UC Course  I have reviewed the triage vital signs and the nursing notes.  Pertinent labs & imaging results that were available during my care of the patient were reviewed by me and considered in my medical decision making (see chart for details).    Flu test negative however given known exposure and classic flu symptoms will treat with tamiflu. Encouraged follow up with any further concerns.   Final Clinical Impressions(s) / UC Diagnoses   Final diagnoses:  Flu-like symptoms  Exposure to the flu  Nausea and vomiting, unspecified vomiting type   Discharge Instructions   None    ED Prescriptions     Medication Sig Dispense Auth. Provider   ondansetron (ZOFRAN-ODT) 4 MG disintegrating tablet Take 1 tablet (4 mg total) by mouth every 8 (eight) hours as needed for nausea or vomiting. 20 tablet Ewell Poe F, PA-C   oseltamivir (TAMIFLU) 75 MG capsule Take 1 capsule (75 mg total) by mouth every 12  (twelve) hours. 10 capsule Francene Finders, PA-C  PDMP not reviewed this encounter.   Francene Finders, PA-C 03/03/21 1355

## 2021-03-03 NOTE — ED Triage Notes (Signed)
Pt c/o cough, sore throat, headache, sinus pressure, fatigue, and n/v/d since yesterday. States can't keep anything down.

## 2023-01-23 IMAGING — DX DG HIP (WITH OR WITHOUT PELVIS) 2-3V*R*
2 series · 3 of 3 positions shown · non-contrast
Comparison: None.

CLINICAL DATA: Fall with right hip pain.

EXAM:
DG HIP (WITH OR WITHOUT PELVIS) 2-3V RIGHT

[pelvis]
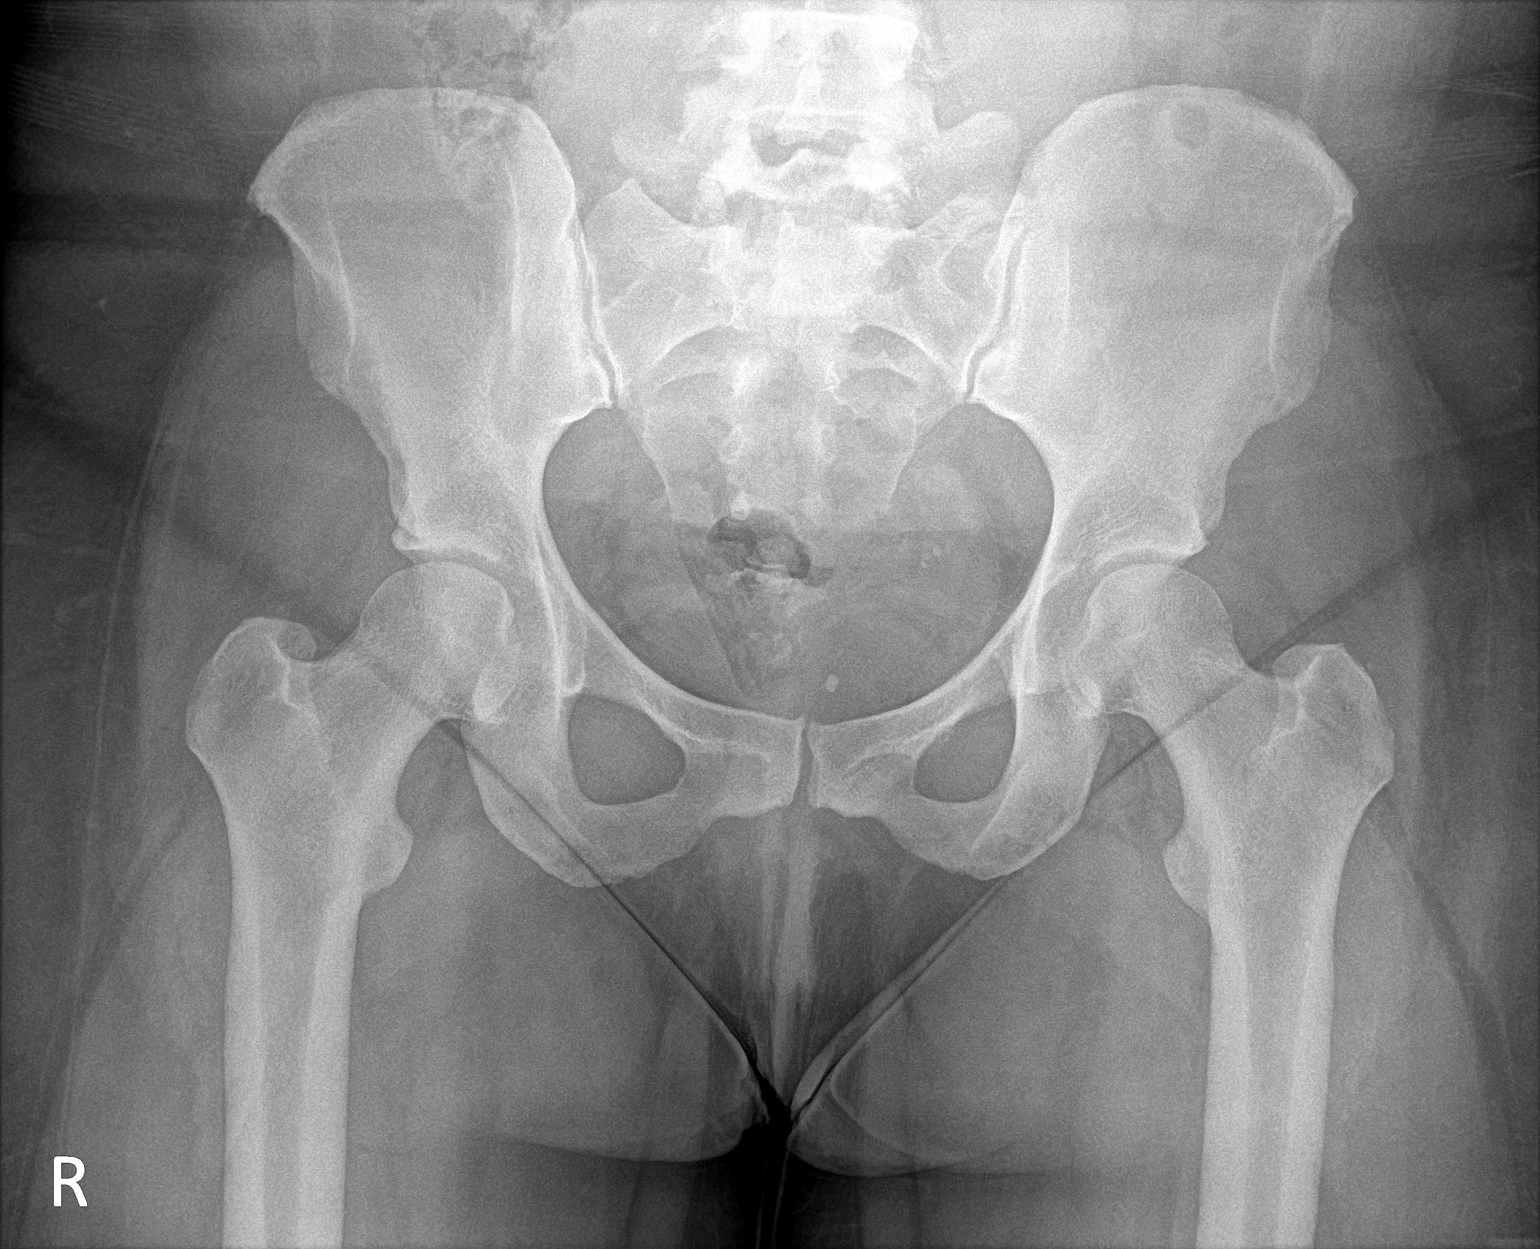

[Series 2: hip · 0.14mm/px · 2 of 2 slices shown]
[im 1/2]
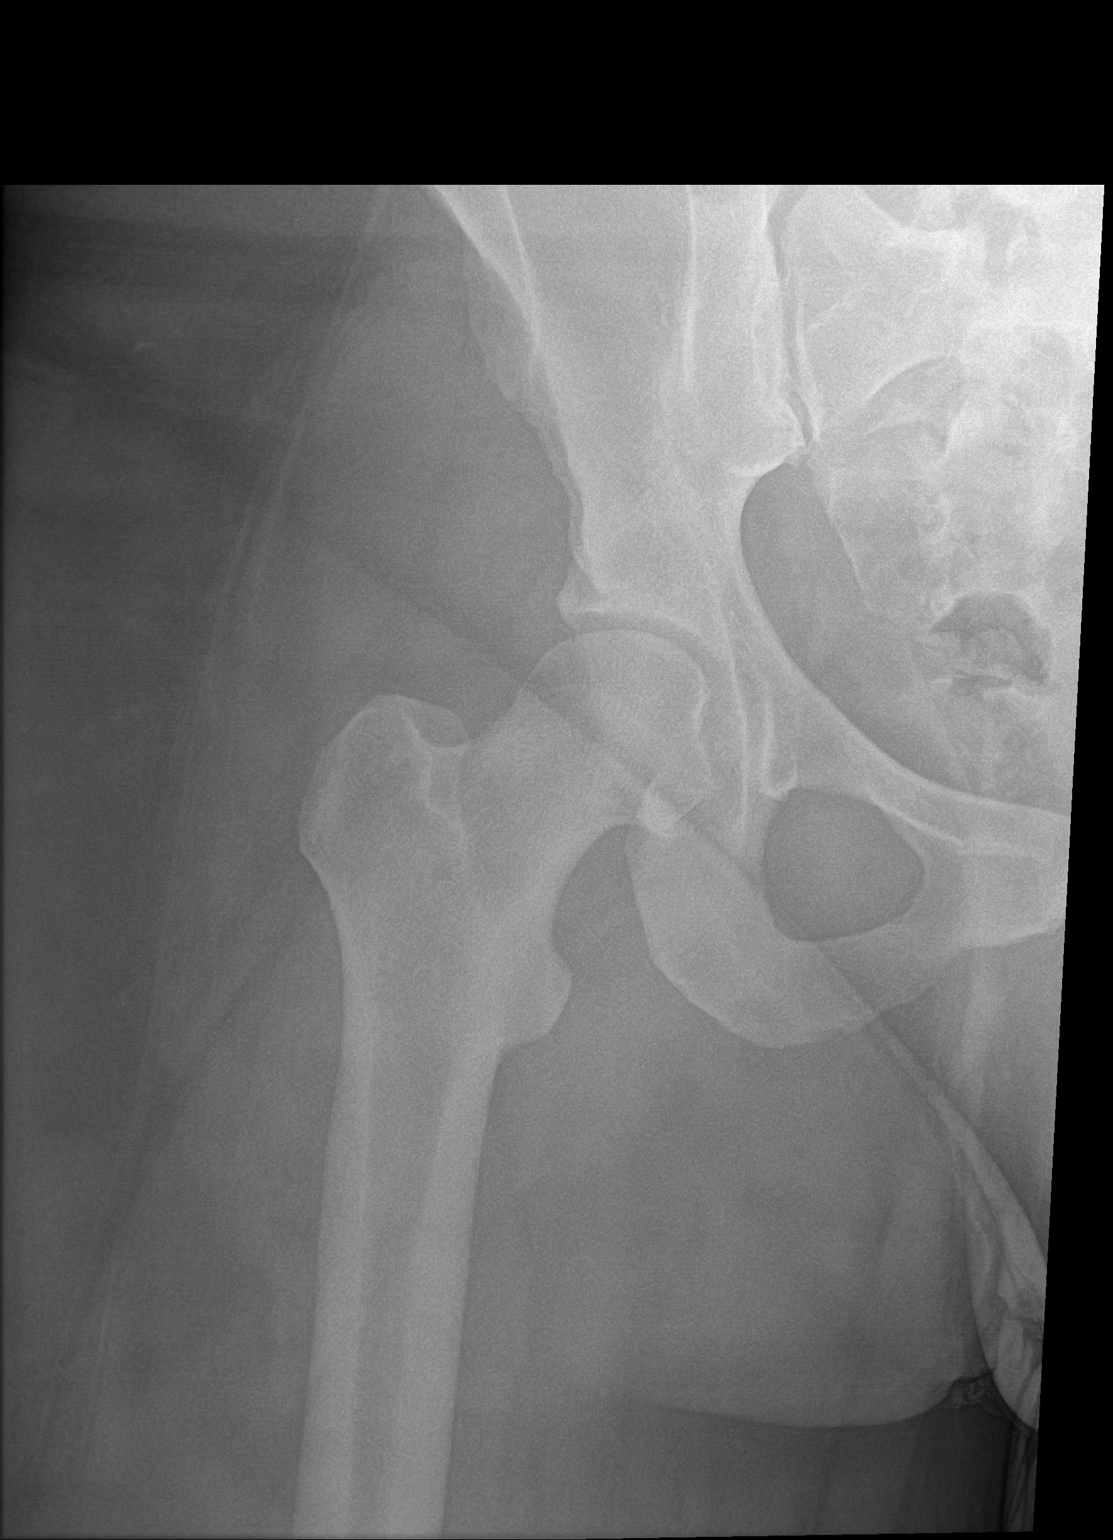
[im 2/2]
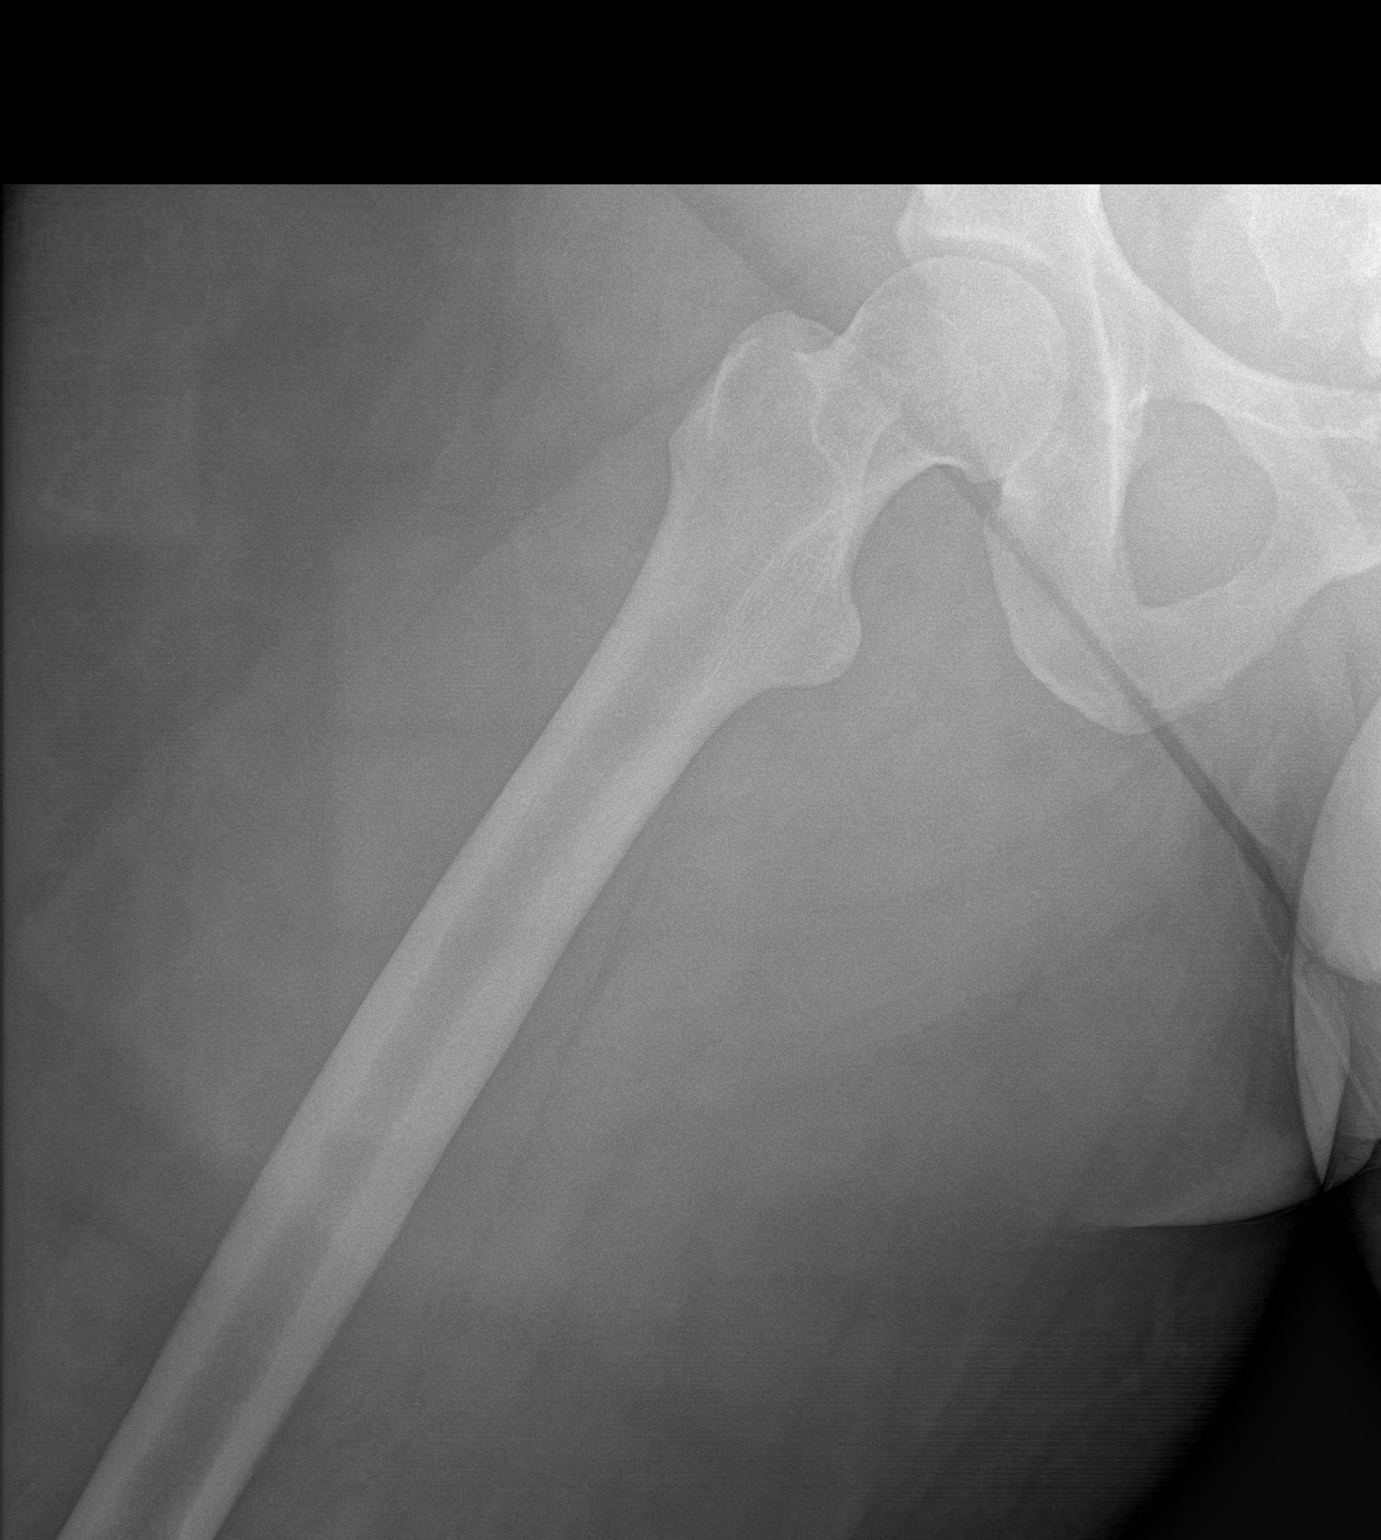

[3 of 3 positions shown; findings below may reference images not displayed]

FINDINGS: The cortical margins of the bony pelvis are intact. No fracture.
Pubic symphysis and sacroiliac joints are congruent. Both femoral
heads are well-seated in the respective acetabula. Right hip joint
spaces preserved. Regional soft tissues are unremarkable.
IMPRESSION: Negative radiographs of the pelvis and right hip.
